# Patient Record
Sex: Male | Born: 1980 | Race: White | Hispanic: No | Smoking: Current every day smoker
Health system: Southern US, Community
[De-identification: ages and names within clinical notes are randomized; demographics above are authoritative.]

## PROBLEM LIST (undated history)

## (undated) DIAGNOSIS — F32A Depression, unspecified: Secondary | ICD-10-CM

## (undated) DIAGNOSIS — F329 Major depressive disorder, single episode, unspecified: Secondary | ICD-10-CM

## (undated) HISTORY — DX: Major depressive disorder, single episode, unspecified: F32.9

## (undated) HISTORY — DX: Depression, unspecified: F32.A

## (undated) HISTORY — PX: CHOLECYSTECTOMY: SHX55

---
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
## 2000-05-13 HISTORY — PX: GALLBLADDER SURGERY: SHX652

## 2001-03-12 ENCOUNTER — Encounter: Admission: RE | Admit: 2001-03-12 | Discharge: 2001-03-12 | Payer: Self-pay | Admitting: *Deleted

## 2001-03-12 ENCOUNTER — Encounter: Payer: Self-pay | Admitting: *Deleted

## 2001-03-13 ENCOUNTER — Encounter: Admission: RE | Admit: 2001-03-13 | Discharge: 2001-03-13 | Payer: Self-pay | Admitting: *Deleted

## 2001-03-13 ENCOUNTER — Encounter: Payer: Self-pay | Admitting: *Deleted

## 2001-03-19 ENCOUNTER — Ambulatory Visit (HOSPITAL_COMMUNITY): Admission: RE | Admit: 2001-03-19 | Discharge: 2001-03-19 | Payer: Self-pay | Admitting: Gastroenterology

## 2001-03-19 ENCOUNTER — Encounter: Payer: Self-pay | Admitting: Gastroenterology

## 2001-03-20 ENCOUNTER — Ambulatory Visit (HOSPITAL_COMMUNITY): Admission: RE | Admit: 2001-03-20 | Discharge: 2001-03-20 | Payer: Self-pay | Admitting: Gastroenterology

## 2001-03-23 ENCOUNTER — Encounter: Payer: Self-pay | Admitting: Gastroenterology

## 2001-03-23 ENCOUNTER — Ambulatory Visit (HOSPITAL_COMMUNITY): Admission: RE | Admit: 2001-03-23 | Discharge: 2001-03-23 | Payer: Self-pay | Admitting: Gastroenterology

## 2001-03-24 ENCOUNTER — Encounter: Payer: Self-pay | Admitting: Gastroenterology

## 2001-03-24 ENCOUNTER — Ambulatory Visit (HOSPITAL_COMMUNITY): Admission: RE | Admit: 2001-03-24 | Discharge: 2001-03-24 | Payer: Self-pay | Admitting: Gastroenterology

## 2001-04-03 ENCOUNTER — Encounter (INDEPENDENT_AMBULATORY_CARE_PROVIDER_SITE_OTHER): Payer: Self-pay | Admitting: Specialist

## 2001-04-03 ENCOUNTER — Observation Stay (HOSPITAL_COMMUNITY): Admission: RE | Admit: 2001-04-03 | Discharge: 2001-04-04 | Payer: Self-pay | Admitting: Surgery

## 2002-02-13 ENCOUNTER — Emergency Department (HOSPITAL_COMMUNITY): Admission: EM | Admit: 2002-02-13 | Discharge: 2002-02-13 | Payer: Self-pay | Admitting: Emergency Medicine

## 2002-02-13 ENCOUNTER — Encounter: Payer: Self-pay | Admitting: Emergency Medicine

## 2003-06-08 ENCOUNTER — Emergency Department (HOSPITAL_COMMUNITY): Admission: EM | Admit: 2003-06-08 | Discharge: 2003-06-08 | Payer: Self-pay | Admitting: Emergency Medicine

## 2004-11-13 ENCOUNTER — Emergency Department (HOSPITAL_COMMUNITY): Admission: EM | Admit: 2004-11-13 | Discharge: 2004-11-13 | Payer: Self-pay | Admitting: Emergency Medicine

## 2007-06-01 ENCOUNTER — Ambulatory Visit: Payer: Self-pay | Admitting: Family Medicine

## 2007-07-02 ENCOUNTER — Ambulatory Visit: Payer: Self-pay | Admitting: Family Medicine

## 2007-07-12 ENCOUNTER — Emergency Department (HOSPITAL_COMMUNITY): Admission: EM | Admit: 2007-07-12 | Discharge: 2007-07-12 | Payer: Self-pay | Admitting: Emergency Medicine

## 2007-08-13 ENCOUNTER — Ambulatory Visit: Payer: Self-pay | Admitting: Family Medicine

## 2007-08-26 ENCOUNTER — Ambulatory Visit: Payer: Self-pay | Admitting: Family Medicine

## 2008-01-07 ENCOUNTER — Ambulatory Visit: Payer: Self-pay | Admitting: Family Medicine

## 2008-02-06 ENCOUNTER — Emergency Department (HOSPITAL_COMMUNITY): Admission: EM | Admit: 2008-02-06 | Discharge: 2008-02-06 | Payer: Self-pay | Admitting: Emergency Medicine

## 2008-03-02 ENCOUNTER — Ambulatory Visit: Payer: Self-pay | Admitting: Family Medicine

## 2008-07-14 ENCOUNTER — Ambulatory Visit: Payer: Self-pay | Admitting: Family Medicine

## 2008-08-28 ENCOUNTER — Emergency Department (HOSPITAL_COMMUNITY): Admission: EM | Admit: 2008-08-28 | Discharge: 2008-08-28 | Payer: Self-pay | Admitting: Emergency Medicine

## 2008-11-18 ENCOUNTER — Emergency Department (HOSPITAL_COMMUNITY): Admission: EM | Admit: 2008-11-18 | Discharge: 2008-11-18 | Payer: Self-pay | Admitting: Emergency Medicine

## 2008-11-25 ENCOUNTER — Emergency Department (HOSPITAL_COMMUNITY): Admission: EM | Admit: 2008-11-25 | Discharge: 2008-11-25 | Payer: Self-pay | Admitting: Emergency Medicine

## 2009-03-02 ENCOUNTER — Ambulatory Visit: Payer: Self-pay | Admitting: Family Medicine

## 2009-09-19 ENCOUNTER — Ambulatory Visit: Payer: Self-pay | Admitting: Physician Assistant

## 2009-10-18 ENCOUNTER — Ambulatory Visit: Payer: Self-pay | Admitting: Family Medicine

## 2010-09-28 NOTE — Procedures (Signed)
[place]  Patient:    Dwayne Drake, Dwayne Drake Visit Number: [id number] MRN: [id number]          Service Type: END Location: ENDO Attending Physician:  Charna Elizabeth Dictated by:   Anselmo Rod, M.D. Proc. Date: 03/20/01 Admit Date:  03/20/2001   CC:         Heather Roberts, M.D.   Procedure Report  DATE OF BIRTH:  Mar 05, 1981  PROCEDURE PERFORMED:  Esophagogastroduodenoscopy.  ENDOSCOPIST:  Anselmo Rod, M.D.  INSTRUMENT USED:  Olympus video panendoscope.  INDICATIONS FOR PROCEDURE:  Severe epigastric pain with nausea and vomiting in a 30 year old white male.  His symptoms started three weeks ago, rule out peptic ulcer disease, gastric ulcers, obstructions.  The patient had an abdominal ultrasound yesterday that was normal.  No gallbladder pathology was noted.  PREPROCEDURE PREPARATION:  Informed consent was obtained from the patient. The patient was fasted for 8 hours prior to the procedure.  PREPROCEDURE PHYSICAL EXAMINATION:  VITAL SIGNS:  Stable.  NECK:  Supple.  CHEST:  Clear to auscultation.  S1 and S2 regular.  ABDOMEN:  Soft, normal bowel sounds.  Epigastric tenderness on palpation with guarding, no rebound or rigidity, no hepatosplenomegaly, no masses palpable.  DESCRIPTION OF PROCEDURE:  The patient was placed in the left lateral decubitus position, and sedated with 75 mg of Demerol and 10 mg of Versed intravenously.  Once the patient was adequately sedated and maintained on low flow oxygen and continuous cardiac monitoring, the Olympus video panendoscope was advanced through the mouth piece, over the tongue, into the esophagus under direct vision.  The entire esophagus appeared normal.  There was no evidence of esophagitis, strictures, masses, or Barretts mucosa.  The scope was then advanced in the stomach.  There was some debris seen in the stomach. The patient seemed to have active vomiting secondary to wretching and  gagging during the procedure, and some of the debris was suctioned out.  Multiple washings were done, no ulcers or erosions were seen.  However, small lesions could have been missed because of the debris in the stomach.  There was a small hiatal hernia seen on retroflexion.  The proximal small bowel appeared normal.  There was no evidence of gastric outlet obstruction.  The patient tolerated the procedure well without complication.  IMPRESSION: 1. Normal appearing esophagus and proximal small bowel. 2. A large amount of debris in the stomach.  No ulcers or gastric outlet    obstruction seen.  RECOMMENDATIONS: 1. Gastric emptying study will be done on the patient ASAP. 2. Basic labs, including a CBC with diff, BMET, hepatic function panel,    amylase and lipase will be checked. 3. Outpatient followup once the above mentioned tests have been done. Dictated by:   Anselmo Rod, M.D. Attending Physician:  Charna Elizabeth DD:  03/20/01 TD:  03/22/01 Job: [id number]

## 2010-09-28 NOTE — Op Note (Signed)
[facility]  Patient:    Dwayne Drake, Dwayne Drake Visit Number: [id number] MRN: [id number]          Service Type: SUR Location: 4W 0470 01 Attending Physician:  Shelly Rubenstein Dictated by:   Abigail Miyamoto, M.D. Proc. Date: 04/03/01 Admit Date:  04/03/2001 Discharge Date: 04/04/2001                             Operative Report  PREOPERATIVE DIAGNOSIS:  Biliary dyskinesia.  POSTOPERATIVE DIAGNOSIS:  Biliary dyskinesia.  PROCEDURE:  Laparoscopic cholecystectomy.  SURGEON:  Abigail Miyamoto, M.D.  ASSISTANT:  Zigmund Daniel, M.D.  ANESTHESIA:  General endotracheal anesthesia.  ESTIMATED BLOOD LOSS:  Minimal.  DESCRIPTION OF PROCEDURE:  The patient was brought to the operating room, identified as Iven Finn. He was placed supine on the operating table and general anesthesia was induced. His abdomen was then prepped and draped in the usual sterile fashion. Using a #15 blade, a small transverse incision was made below the umbilicus. The incision was carried down to the fascia which was then opened with a scalpel. A hemostat was then used to pass through the peritoneal cavity. A #0 Vicryl pursestring suture was placed around the fascial opening. The Hasson port was then placed through the opening and insufflation of the abdomen was begun. An 11 mm port was then placed in the patients epigastrium and two 5 mm ports were placed in the patients right flank all under direct vision. The gallbladder was grasped and retracted above the liver bed. Dissection was carried down to the base of the gallbladder. The cystic duct was dissected out, clipped twice proximally, once distally and transected with the scissors. The cystic artery was then dissected out, clipped once proximally, once distally and transected as well. The gallbladder was then slowly dissected free from the liver bed with the electrocautery. Hemostasis appeared to be achieved in the  liver bed with the cautery. Once the gallbladder was excised from the liver bed it was removed through the port at the umbilicus. The umbilicus was then tied in place closing the fascial defect. A separate #0 Vicryl interrupted suture was placed also at the fascia. The abdomen was irrigated with normal saline. Again hemostasis appeared to be achieved. All ports were then removed under direct vision and the abdomen was deflated. The patient tolerated the procedure well. All sponge, needle and instrument counts were correct at the end of the procedure. The patient was then extubated in the operating room and taken in stable condition to the recovery room. Dictated by:   Abigail Miyamoto, M.D. Attending Physician:  Shelly Rubenstein DD:  04/03/01 TD:  04/05/01 Job: [id number]
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
## 2011-02-11 LAB — URINALYSIS, ROUTINE W REFLEX MICROSCOPIC
Bilirubin Urine: NEGATIVE
Glucose, UA: NEGATIVE
Nitrite: NEGATIVE
Urobilinogen, UA: 0.2

## 2011-05-20 ENCOUNTER — Encounter: Payer: Self-pay | Admitting: Internal Medicine

## 2011-05-20 ENCOUNTER — Ambulatory Visit (INDEPENDENT_AMBULATORY_CARE_PROVIDER_SITE_OTHER): Payer: PRIVATE HEALTH INSURANCE | Admitting: Internal Medicine

## 2011-05-20 VITALS — BP 110/74 | HR 61 | Temp 97.1°F | Resp 16 | Ht 64.0 in | Wt 133.0 lb

## 2011-05-20 DIAGNOSIS — Z720 Tobacco use: Secondary | ICD-10-CM | POA: Insufficient documentation

## 2011-05-20 DIAGNOSIS — F17200 Nicotine dependence, unspecified, uncomplicated: Secondary | ICD-10-CM

## 2011-05-20 DIAGNOSIS — F418 Other specified anxiety disorders: Secondary | ICD-10-CM

## 2011-05-20 DIAGNOSIS — F341 Dysthymic disorder: Secondary | ICD-10-CM

## 2011-05-20 MED ORDER — ALPRAZOLAM 0.5 MG PO TABS: 0.5000 mg | ORAL_TABLET | Freq: Two times a day (BID) | ORAL | Status: DC | PRN

## 2011-05-20 MED ORDER — BUSPIRONE HCL 10 MG PO TABS: 10.0000 mg | ORAL_TABLET | Freq: Three times a day (TID) | ORAL | Status: AC

## 2011-05-20 NOTE — Progress Notes (Signed)
  Subjective:    Patient ID: Dwayne Drake, male    DOB: [birth year], 31 y.o.   MRN: [id number]  HPI  New to me he requests a refill on meds for GAD/panic/depression, he has been on buspar and xanax for about 3 years and he has had a good response to these but his prior PCP is no longer available and he wants to continue on them.  Review of Systems  Constitutional: Negative.   HENT: Negative.   Eyes: Negative.   Respiratory: Negative.   Cardiovascular: Negative for chest pain, palpitations and leg swelling.  Gastrointestinal: Negative.   Genitourinary: Negative.   Musculoskeletal: Negative.   Skin: Negative.   Neurological: Negative.   Hematological: Negative for adenopathy. Does not bruise/bleed easily.  Psychiatric/Behavioral: Positive for sleep disturbance and dysphoric mood. Negative for suicidal ideas, hallucinations, behavioral problems, confusion, self-injury, decreased concentration and agitation. The patient is nervous/anxious. The patient is not hyperactive.        Objective:   Physical Exam  Vitals reviewed. Constitutional: He is oriented to person, place, and time. He appears well-developed and well-nourished. No distress.  HENT:  Head: Normocephalic and atraumatic.  Mouth/Throat: No oropharyngeal exudate.  Eyes: Conjunctivae are normal. Right eye exhibits no discharge. Left eye exhibits no discharge. No scleral icterus.  Neck: Normal range of motion. Neck supple. No JVD present. No tracheal deviation present. No thyromegaly present.  Cardiovascular: Normal rate, regular rhythm, normal heart sounds and intact distal pulses.  Exam reveals no gallop and no friction rub.   No murmur heard. Pulmonary/Chest: Effort normal and breath sounds normal. No stridor. No respiratory distress. He has no wheezes. He has no rales. He exhibits no tenderness.  Abdominal: Soft. Bowel sounds are normal. He exhibits no distension and no mass. There is no tenderness. There is no rebound and  no guarding.  Musculoskeletal: Normal range of motion. He exhibits no edema and no tenderness.  Lymphadenopathy:    He has no cervical adenopathy.  Neurological: He is oriented to person, place, and time.  Skin: Skin is warm and dry. No rash noted. He is not diaphoretic. No erythema. No pallor.  Psychiatric: He has a normal mood and affect. His behavior is normal. Judgment and thought content normal.          Assessment & Plan:

## 2011-05-20 NOTE — Patient Instructions (Signed)

## 2011-05-20 NOTE — Assessment & Plan Note (Signed)
Restart buspar and xanax, he deferred on psychotherapy, I asked him to stop drinking

## 2011-05-20 NOTE — Assessment & Plan Note (Signed)
I asked him to stop smoking and he agrees to try

## 2011-07-19 ENCOUNTER — Telehealth: Payer: Self-pay | Admitting: Internal Medicine

## 2011-07-19 NOTE — Telephone Encounter (Signed)
The pt called and is hoping to get a refill of xanax. Thanks!

## 2011-07-22 NOTE — Telephone Encounter (Signed)
Returned call to patient//LMOVM to call back, need to know if he received printed rx  From 05/20/11 #[id number]

## 2011-07-24 NOTE — Telephone Encounter (Signed)
Rx called to [facility]

## 2011-09-23 ENCOUNTER — Telehealth: Payer: Self-pay

## 2011-09-23 NOTE — Telephone Encounter (Signed)
Error

## 2011-09-24 ENCOUNTER — Telehealth: Payer: Self-pay | Admitting: Internal Medicine

## 2011-09-24 DIAGNOSIS — F418 Other specified anxiety disorders: Secondary | ICD-10-CM

## 2011-09-24 NOTE — Telephone Encounter (Signed)
Pt requesting  xanax reinstated to [facility]--pt ph# [phone]--pt says he travels a lot and prescription was sent to another pharm.

## 2011-09-25 NOTE — Telephone Encounter (Signed)
Pt is requesting refill of Xanax. He states that Rx was filled at a out of town pharmacy and remaining refills cannot be transferred to his local pharmacy. Please advise on refill in Dr Yetta Barre' absence, thanks!

## 2011-09-25 NOTE — Telephone Encounter (Signed)
OK #20 no ref OV w/Dr Yetta Barre next week to discuss Rx Thx

## 2011-09-26 MED ORDER — ALPRAZOLAM 0.5 MG PO TABS: 0.5000 mg | ORAL_TABLET | Freq: Two times a day (BID) | ORAL | Status: DC | PRN

## 2011-09-26 NOTE — Telephone Encounter (Signed)
Pt advised per AVP's recommendation and will call back to schedule OV with [facility].

## 2011-10-10 ENCOUNTER — Ambulatory Visit (INDEPENDENT_AMBULATORY_CARE_PROVIDER_SITE_OTHER): Payer: PRIVATE HEALTH INSURANCE | Admitting: Internal Medicine

## 2011-10-10 ENCOUNTER — Encounter: Payer: Self-pay | Admitting: Internal Medicine

## 2011-10-10 VITALS — BP 118/68 | HR 67 | Temp 97.7°F | Resp 20 | Wt 125.0 lb

## 2011-10-10 DIAGNOSIS — F341 Dysthymic disorder: Secondary | ICD-10-CM

## 2011-10-10 DIAGNOSIS — R079 Chest pain, unspecified: Secondary | ICD-10-CM

## 2011-10-10 DIAGNOSIS — F329 Major depressive disorder, single episode, unspecified: Secondary | ICD-10-CM

## 2011-10-10 DIAGNOSIS — F17200 Nicotine dependence, unspecified, uncomplicated: Secondary | ICD-10-CM

## 2011-10-10 DIAGNOSIS — F418 Other specified anxiety disorders: Secondary | ICD-10-CM

## 2011-10-10 DIAGNOSIS — Z720 Tobacco use: Secondary | ICD-10-CM

## 2011-10-10 MED ORDER — SERTRALINE HCL 50 MG PO TABS: 50.0000 mg | ORAL_TABLET | Freq: Every day | ORAL | Status: DC

## 2011-10-10 MED ORDER — ALPRAZOLAM 0.5 MG PO TABS: 0.5000 mg | ORAL_TABLET | Freq: Three times a day (TID) | ORAL | Status: AC | PRN

## 2011-10-10 NOTE — Assessment & Plan Note (Signed)
His EKG does not look suspicious though he does have some benign variation c/w his body shape, his pain sounds emotional and MS so I did not pursue any urgent interventions, today I have asked him to get a CXR done to look for structural lesions in his lungs and chest wall, I will check some cardiac enzymes to be certain that he does not have any ischemia and a d-dimer to look for PE, I will also look at other labs to look for organ pathology

## 2011-10-10 NOTE — Patient Instructions (Signed)
Anxiety and Panic Attacks  Your caregiver has informed you that you are having an anxiety or panic attack. There may be many forms of this. Most of the time these attacks come suddenly and without warning. They come at any time of day, including periods of sleep, and at any time of life. They may be strong and unexplained. Although panic attacks are very scary, they are physically harmless. Sometimes the cause of your anxiety is not known. Anxiety is a protective mechanism of the body in its fight or flight mechanism. Most of these perceived danger situations are actually nonphysical situations (such as anxiety over losing a job).  CAUSES    The causes of an anxiety or panic attack are many. Panic attacks may occur in otherwise healthy people given a certain set of circumstances. There may be a genetic cause for panic attacks. Some medications may also have anxiety as a side effect.  SYMPTOMS    Some of the most common feelings are:   Intense terror.   Dizziness, feeling faint.   Hot and cold flashes.   Fear of going crazy.   Feelings that nothing is real.   Sweating.   Shaking.   Chest pain or a fast heartbeat (palpitations).   Smothering, choking sensations.   Feelings of impending doom and that death is near.   Tingling of extremities, this may be from over-breathing.   Altered reality (derealization).   Being detached from yourself (depersonalization).  Several symptoms can be present to make up anxiety or panic attacks.  DIAGNOSIS    The evaluation by your caregiver will depend on the type of symptoms you are experiencing. The diagnosis of anxiety or panic attack is made when no physical illness can be determined to be a cause of the symptoms.  TREATMENT    Treatment to prevent anxiety and panic attacks may include:   Avoidance of circumstances that cause anxiety.   Reassurance and relaxation.   Regular exercise.   Relaxation therapies, such as yoga.    Psychotherapy with a psychiatrist or therapist.   Avoidance of caffeine, alcohol and illegal drugs.   Prescribed medication.  SEEK IMMEDIATE MEDICAL CARE IF:     You experience panic attack symptoms that are different than your usual symptoms.   You have any worsening or concerning symptoms.  Document Released: 04/29/2005 Document Revised: 04/18/2011 Document Reviewed: 08/31/2009  Exit[employer] Patient Information 2012 [employer]    Chest Pain (Nonspecific)  It is often hard to give a specific diagnosis for the cause of chest pain. There is always a chance that your pain could be related to something serious, such as a heart attack or a blood clot in the lungs. You need to follow up with your caregiver for further evaluation.  CAUSES     Heartburn.   Pneumonia or bronchitis.   Anxiety or stress.   Inflammation around your heart (pericarditis) or lung (pleuritis or pleurisy).   A blood clot in the lung.   A collapsed lung (pneumothorax). It can develop suddenly on its own (spontaneous pneumothorax) or from injury (trauma) to the chest.   Shingles infection (herpes zoster virus).  The chest wall is composed of bones, muscles, and cartilage. Any of these can be the source of the pain.   The bones can be bruised by injury.   The muscles or cartilage can be strained by coughing or overwork.   The cartilage can be affected by inflammation and become sore (costochondritis).  DIAGNOSIS      Lab tests or other studies, such as X-rays, electrocardiography, stress testing, or cardiac imaging, may be needed to find the cause of your pain.    TREATMENT     Treatment depends on what may be causing your chest pain. Treatment may include:   Acid blockers for heartburn.   Anti-inflammatory medicine.   Pain medicine for inflammatory conditions.   Antibiotics if an infection is present.   You may be advised to change lifestyle habits. This includes stopping smoking and avoiding alcohol, caffeine, and chocolate.    You may be advised to keep your head raised (elevated) when sleeping. This reduces the chance of acid going backward from your stomach into your esophagus.   Most of the time, nonspecific chest pain will improve within 2 to 3 days with rest and mild pain medicine.  HOME CARE INSTRUCTIONS     If antibiotics were prescribed, take your antibiotics as directed. Finish them even if you start to feel better.   For the next few days, avoid physical activities that bring on chest pain. Continue physical activities as directed.   Do not smoke.   Avoid drinking alcohol.   Only take over-the-counter or prescription medicine for pain, discomfort, or fever as directed by your caregiver.   Follow your caregiver's suggestions for further testing if your chest pain does not go away.   Keep any follow-up appointments you made. If you do not go to an appointment, you could develop lasting (chronic) problems with pain. If there is any problem keeping an appointment, you must call to reschedule.  SEEK MEDICAL CARE IF:     You think you are having problems from the medicine you are taking. Read your medicine instructions carefully.   Your chest pain does not go away, even after treatment.   You develop a rash with blisters on your chest.  SEEK IMMEDIATE MEDICAL CARE IF:     You have increased chest pain or pain that spreads to your arm, neck, jaw, back, or abdomen.   You develop shortness of breath, an increasing cough, or you are coughing up blood.   You have severe back or abdominal pain, feel nauseous, or vomit.   You develop severe weakness, fainting, or chills.   You have a fever.  THIS IS AN EMERGENCY. Do not wait to see if the pain will go away. Get medical help at once. Call your local emergency services (911 in [place] Do not drive yourself to the hospital.  MAKE SURE YOU:     Understand these instructions.   Will watch your condition.   Will get help right away if you are not doing well or get worse.   Document Released: 02/06/2005 Document Revised: 04/18/2011 Document Reviewed: 12/03/2007  [employer] Patient Information 2012 [employer]

## 2011-10-10 NOTE — Progress Notes (Signed)
Subjective:    Patient ID: Dwayne Drake, male    DOB: 02/05/1981, 31 y.o.   MRN: [id number]  Chest Pain  This is a new problem. The current episode started more than 1 month ago. The onset quality is gradual. The problem occurs intermittently. The problem has been unchanged. The pain is present in the lateral region (left and right). The pain is at a severity of 2/10. The pain is mild. The quality of the pain is described as sharp. The pain does not radiate. Pertinent negatives include no abdominal pain, back pain, claudication, cough, diaphoresis, dizziness, exertional chest pressure, fever, headaches, hemoptysis, irregular heartbeat, leg pain, lower extremity edema, malaise/fatigue, nausea, near-syncope, numbness, orthopnea, palpitations, PND, shortness of breath, sputum production, syncope, vomiting or weakness. The pain is aggravated by emotional upset. He has tried nothing for the symptoms. Risk factors include smoking/tobacco exposure.  Pertinent negatives for past medical history include no seizures.      Review of Systems  Constitutional: Negative for fever, chills, malaise/fatigue, diaphoresis, activity change, appetite change, fatigue and unexpected weight change.  HENT: Negative.   Eyes: Negative.   Respiratory: Negative for apnea, cough, hemoptysis, sputum production, choking, chest tightness, shortness of breath, wheezing and stridor.   Cardiovascular: Positive for chest pain. Negative for palpitations, orthopnea, claudication, leg swelling, syncope, PND and near-syncope.  Gastrointestinal: Negative for nausea, vomiting, abdominal pain, diarrhea, constipation, blood in stool, abdominal distention and anal bleeding.  Genitourinary: Negative.   Musculoskeletal: Negative for myalgias, back pain, arthralgias and gait problem.  Skin: Negative for color change, pallor, rash and wound.  Neurological: Negative for dizziness, tremors, seizures, syncope, facial asymmetry, speech  difficulty, weakness, light-headedness, numbness and headaches.  Hematological: Negative for adenopathy. Does not bruise/bleed easily.  Psychiatric/Behavioral: Positive for sleep disturbance and dysphoric mood. Negative for suicidal ideas, hallucinations, behavioral problems, confusion, self-injury, decreased concentration and agitation. The patient is nervous/anxious (and panic attacks). The patient is not hyperactive.        Objective:   Physical Exam  Vitals reviewed. Constitutional: He is oriented to person, place, and time. He appears well-developed and well-nourished. No distress.  HENT:  Head: Normocephalic and atraumatic.  Mouth/Throat: Oropharynx is clear and moist. No oropharyngeal exudate.  Eyes: Conjunctivae are normal. Right eye exhibits no discharge. Left eye exhibits no discharge. No scleral icterus.  Neck: Normal range of motion. Neck supple. No JVD present. No tracheal deviation present. No thyromegaly present.  Cardiovascular: Normal rate, normal heart sounds and intact distal pulses.  Exam reveals no gallop and no friction rub.   No murmur heard. Pulmonary/Chest: Effort normal and breath sounds normal. No accessory muscle usage or stridor. Not tachypneic. No respiratory distress. He has no decreased breath sounds. He has no wheezes. He has no rhonchi. He has no rales. Chest wall is not dull to percussion. He exhibits no mass, no tenderness, no bony tenderness, no laceration, no crepitus, no edema, no deformity, no swelling and no retraction.  Abdominal: Soft. Bowel sounds are normal. He exhibits no distension and no mass. There is no tenderness. There is no rebound and no guarding.  Musculoskeletal: Normal range of motion. He exhibits no edema and no tenderness.  Lymphadenopathy:    He has no cervical adenopathy.  Neurological: He is oriented to person, place, and time.  Skin: Skin is warm and dry. No rash noted. He is not diaphoretic. No erythema. No pallor.  Psychiatric:  His behavior is normal. Judgment and thought content normal. His mood appears anxious. His  affect is not angry, not blunt, not labile and not inappropriate. His speech is not rapid and/or pressured, not delayed, not tangential and not slurred. He is not agitated, not aggressive, is not hyperactive, not slowed, not withdrawn, not actively hallucinating and not combative. Thought content is not paranoid and not delusional. Cognition and memory are normal. Cognition and memory are not impaired. He does not express impulsivity or inappropriate judgment. He does not exhibit a depressed mood. He expresses no homicidal and no suicidal ideation. He expresses no suicidal plans and no homicidal plans. He is communicative. He exhibits normal recent memory and normal remote memory. He is attentive.          Assessment & Plan:

## 2011-10-10 NOTE — Assessment & Plan Note (Signed)
He has increased his dose of xanax and therefore ran out early so I gave him an early refill today, I want to check his labs to look for organic illness like thyroid disease that can cause anxiety and will check his UDS to screen for substance abuse, he was asked to start sertraline to help with the anxiety and depression.

## 2011-10-10 NOTE — Assessment & Plan Note (Signed)
He is not ready to quit smoking 

## 2011-10-16 ENCOUNTER — Encounter: Payer: Self-pay | Admitting: Internal Medicine

## 2011-10-18 ENCOUNTER — Telehealth: Payer: Self-pay | Admitting: Internal Medicine

## 2011-10-18 NOTE — Telephone Encounter (Signed)
Dismissal Letter sent by Certified Mail 10/18/2011  Received Return Receipt showing that the Dismissal Letter has been picked up 10/21/2011

## 2013-08-27 ENCOUNTER — Emergency Department (HOSPITAL_COMMUNITY)
Admission: EM | Admit: 2013-08-27 | Discharge: 2013-08-27 | Disposition: A | Discharge status: Home / Self Care | Payer: PRIVATE HEALTH INSURANCE | Attending: Emergency Medicine | Admitting: Emergency Medicine

## 2013-08-27 ENCOUNTER — Encounter (HOSPITAL_COMMUNITY): Payer: Self-pay | Admitting: Emergency Medicine

## 2013-08-27 DIAGNOSIS — Z79899 Other long term (current) drug therapy: Secondary | ICD-10-CM | POA: Insufficient documentation

## 2013-08-27 DIAGNOSIS — L539 Erythematous condition, unspecified: Secondary | ICD-10-CM | POA: Insufficient documentation

## 2013-08-27 DIAGNOSIS — F17200 Nicotine dependence, unspecified, uncomplicated: Secondary | ICD-10-CM | POA: Insufficient documentation

## 2013-08-27 DIAGNOSIS — R609 Edema, unspecified: Secondary | ICD-10-CM | POA: Insufficient documentation

## 2013-08-27 DIAGNOSIS — F329 Major depressive disorder, single episode, unspecified: Secondary | ICD-10-CM | POA: Insufficient documentation

## 2013-08-27 DIAGNOSIS — F3289 Other specified depressive episodes: Secondary | ICD-10-CM | POA: Insufficient documentation

## 2013-08-27 DIAGNOSIS — L0390 Cellulitis, unspecified: Secondary | ICD-10-CM

## 2013-08-27 DIAGNOSIS — H60399 Other infective otitis externa, unspecified ear: Secondary | ICD-10-CM | POA: Insufficient documentation

## 2013-08-27 DIAGNOSIS — L0291 Cutaneous abscess, unspecified: Secondary | ICD-10-CM

## 2013-08-27 MED ORDER — HYDROCODONE-ACETAMINOPHEN 5-325 MG PO TABS
2.0000 | ORAL_TABLET | Freq: Once | ORAL | Status: AC
  Administered 2013-08-27: 2 via ORAL
  Filled 2013-08-27: qty 2

## 2013-08-27 MED ORDER — ONDANSETRON 4 MG PO TBDP
4.0000 mg | ORAL_TABLET | Freq: Once | ORAL | Status: AC
  Administered 2013-08-27: 4 mg via ORAL
  Filled 2013-08-27: qty 1

## 2013-08-27 MED ORDER — CLINDAMYCIN HCL 150 MG PO CAPS: 300.0000 mg | ORAL_CAPSULE | Freq: Three times a day (TID) | ORAL | Status: DC

## 2013-08-27 MED ORDER — OXYCODONE-ACETAMINOPHEN 5-325 MG PO TABS: 2.0000 | ORAL_TABLET | ORAL | Status: DC | PRN

## 2013-08-27 NOTE — Discharge Instructions (Signed)
Take Clindamycin as directed until gone. Take Percocet as needed for pain. Refer to attached documents for more information. Return to the ED with worsening or concerning symptoms.  °

## 2013-08-27 NOTE — ED Notes (Signed)
Pt states that right earlobe began swelling 2 days ago with sharp pain shooting to teeth and head, small laceration present to ear lobe, pt states this is from where he "popped" the swelling. Denies recent piercing or gauging to ear.

## 2013-08-27 NOTE — Progress Notes (Signed)
[facility] provided pt with a list of primary care resources to help patient establish primary care.  °

## 2013-08-27 NOTE — ED Provider Notes (Signed)
CSN: [id number]     Arrival date & time 08/27/13  1222 History  This chart was scribed for non-physician practitioner, Emilia BeckKaitlyn Lakeshia Dohner, PA-C, working with Doug SouSam Jacubowitz, MD by Charline BillsEssence Howell, ED Scribe. This patient was seen in room WTR5/WTR5 and the patient's care was started at 1:04 PM.   Chief Complaint  Patient presents with  . Abscess    ear    Patient is a 33 y.o. male presenting with abscess. The history is provided by the patient. No language interpreter was used.  Abscess Location:  Head/neck Head/neck abscess location:  R ear Abscess quality: fluctuance and painful   Duration:  2 days Pain details:    Quality:  Sharp and shooting   Severity:  Severe   Duration:  2 days   Timing:  Constant   Progression:  Worsening Chronicity:  New Relieved by:  Nothing Worsened by:  Nothing tried Ineffective treatments:  None tried Associated symptoms: fever (subjective)    HPI Comments: Dwayne Drake is a 33 y.o. male who presents to the Emergency Department complaining of R earlobe abscess onset 2 days ago. He reports associated swelling and a sharp, shooting pain that radiates to his jaw. Pain is worsened with tilting his head. Pt also reports a subjective fever, ED temperature 98.2 F. Pt tried to "pop" the abscess yesterday with no relief.  Past Medical History  Diagnosis Date  . Depression    Past Surgical History  Procedure Laterality Date  . Gallbladder surgery  2002  . Cholecystectomy     Family History  Problem Relation Age of Onset  . Cancer Neg Hx   . Alcohol abuse Neg Hx   . Diabetes Neg Hx   . Drug abuse Neg Hx   . Depression Neg Hx   . Heart disease Neg Hx    History  Substance Use Topics  . Smoking status: Current Every Day Smoker -- 2.00 packs/day for 15 years    Types: Cigarettes  . Smokeless tobacco: Never Used  . Alcohol Use: 9.0 oz/week    15 Cans of beer per week    Review of Systems  Constitutional: Positive for fever (subjective).   HENT: Positive for ear pain.   All other systems reviewed and are negative.  Allergies  Sulfa drugs cross reactors  Home Medications   Prior to Admission medications   Medication Sig Start Date End Date Taking? Authorizing Provider  sertraline (ZOLOFT) 50 MG tablet Take 1 tablet (50 mg total) by mouth daily. 10/10/11 10/09/12  Etta Grandchildhomas L Jones, MD   Triage Vitals: BP 122/76  Pulse 111  Temp(Src) 98.2 F (36.8 C) (Oral)  Resp 16  SpO2 100% Physical Exam  Nursing note and vitals reviewed. Constitutional: He is oriented to person, place, and time. He appears well-developed and well-nourished. No distress.  HENT:  Head: Normocephalic and atraumatic.  Right Ear: There is swelling and tenderness.  Fluctuant mass noted at R ear  Eyes: EOM are normal.  Neck: Neck supple. Muscular tenderness (R neck) present. Edema (R neck) and erythema (R neck) present.  Cardiovascular: Normal rate.   Pulmonary/Chest: Effort normal. No respiratory distress.  Musculoskeletal: Normal range of motion.  Lymphadenopathy:    He has no cervical adenopathy.  Neurological: He is alert and oriented to person, place, and time.  Skin: Skin is warm and dry.      Psychiatric: He has a normal mood and affect. His behavior is normal.    ED Course  Procedures (including critical  care time) DIAGNOSTIC STUDIES: Oxygen Saturation is 100% on RA, normal by my interpretation.    INCISION AND DRAINAGE PROCEDURE NOTE: Patient identification was confirmed and verbal consent was obtained. This procedure was performed by Emilia BeckKaitlyn Oona Trammel, PA-C at 1:07 PM. Site: R earlobe Anesthetic used (type and amt): none Blade size: 10 blade Drainage: 3 mL Complexity: Complex Packing used: none Site anesthetized, incision made over site, wound drained and explored loculations, rinsed with copious amounts of normal saline, wound packed with sterile gauze, covered with dry, sterile dressing.  Pt tolerated procedure well without  complications.  Instructions for care discussed verbally and pt provided with additional written instructions for homecare and f/u.  COORDINATION OF CARE: 1:16 PM-Discussed treatment plan which includes I&D, antibiotics and medication for pain with pt at bedside and pt agreed to plan.   Labs Review Labs Reviewed - No data to display  Imaging Review No results found.   EKG Interpretation None      MDM   Final diagnoses:  Abscess and cellulitis    Abscess drained without complication. Patient will be discharged with Clindamycin and Percocet. Patient is tachycardic likely due to pain. Other vitals stable and patient afebrile.   I personally performed the services described in this documentation, which was scribed in my presence. The recorded information has been reviewed and is accurate.    Emilia BeckKaitlyn Madaleine Simmon, PA-C 08/27/13 1820

## 2013-08-27 NOTE — ED Provider Notes (Signed)
Medical screening examination/treatment/procedure(s) were performed by non-physician practitioner and as supervising physician I was immediately available for consultation/collaboration.   EKG Interpretation None        Gwyneth SproutWhitney Marrietta Thunder, MD 08/27/13 2144

## 2013-08-31 ENCOUNTER — Emergency Department (HOSPITAL_COMMUNITY)
Admission: EM | Admit: 2013-08-31 | Discharge: 2013-08-31 | Disposition: A | Discharge status: Home / Self Care | Payer: PRIVATE HEALTH INSURANCE | Attending: Emergency Medicine | Admitting: Emergency Medicine

## 2013-08-31 ENCOUNTER — Encounter (HOSPITAL_COMMUNITY): Payer: Self-pay | Admitting: Emergency Medicine

## 2013-08-31 DIAGNOSIS — L0390 Cellulitis, unspecified: Secondary | ICD-10-CM

## 2013-08-31 DIAGNOSIS — Z791 Long term (current) use of non-steroidal anti-inflammatories (NSAID): Secondary | ICD-10-CM | POA: Insufficient documentation

## 2013-08-31 DIAGNOSIS — L0291 Cutaneous abscess, unspecified: Secondary | ICD-10-CM

## 2013-08-31 DIAGNOSIS — H60399 Other infective otitis externa, unspecified ear: Secondary | ICD-10-CM | POA: Insufficient documentation

## 2013-08-31 DIAGNOSIS — Z792 Long term (current) use of antibiotics: Secondary | ICD-10-CM | POA: Insufficient documentation

## 2013-08-31 DIAGNOSIS — Z8659 Personal history of other mental and behavioral disorders: Secondary | ICD-10-CM | POA: Insufficient documentation

## 2013-08-31 DIAGNOSIS — F17200 Nicotine dependence, unspecified, uncomplicated: Secondary | ICD-10-CM | POA: Insufficient documentation

## 2013-08-31 MED ORDER — CLINDAMYCIN HCL 150 MG PO CAPS: 450.0000 mg | ORAL_CAPSULE | Freq: Four times a day (QID) | ORAL | Status: DC

## 2013-08-31 MED ORDER — OXYCODONE-ACETAMINOPHEN 5-325 MG PO TABS: 1.0000 | ORAL_TABLET | Freq: Three times a day (TID) | ORAL | Status: DC | PRN

## 2013-08-31 NOTE — Discharge Instructions (Signed)
Please call your doctor for a followup appointment within 24-48 hours. When you talk to your doctor please let them know that you were seen in the emergency department and have them acquire all of your records so that they can discuss the findings with you and formulate a treatment plan to fully care for your new and ongoing problems. Please call and set-up an appointment with Ear, Nose, and Throat physician for re-assessment - need to be re-assessed within 48 hours Please remove packing within 2 days Please take antibiotics as prescribed and on full stomac Please take medications, pain medications as prescribed rash on pain medications there is to be no drinking alcohol, driving, operating any heavy machinery. If there is extra please disposer proper manner. Please do not take any extra Tylenol for this can lead to Tylenol overdose and liver failure. Please apply warm compressions Please rest and stay hydrated Please keep site covered with gauze - please keep dry, if the gauze become wet, please change Please continue to monitor symptoms closely and if symptoms are to worsen or change (fever greater than 101, chills, chest pain, shortness of breath, difficulty breathing, numbness, tingling, swelling to the face, numbness to the face, inability to swallow, neck pain, active drainage, active bleeding, blurred vision) please report back to the ED immediately   Abscess An abscess (boil or furuncle) is an infected area on or under the skin. This area is filled with yellowish-white fluid (pus) and other material (debris). HOME CARE   Only take medicines as told by your doctor.  If you were given antibiotic medicine, take it as directed. Finish the medicine even if you start to feel better.  If gauze is used, follow your doctor's directions for changing the gauze.  To avoid spreading the infection:  Keep your abscess covered with a bandage.  Wash your hands well.  Do not share personal care  items, towels, or whirlpools with others.  Avoid skin contact with others.  Keep your skin and clothes clean around the abscess.  Keep all doctor visits as told. GET HELP RIGHT AWAY IF:   You have more pain, puffiness (swelling), or redness in the wound site.  You have more fluid or blood coming from the wound site.  You have muscle aches, chills, or you feel sick.  You have a fever. MAKE SURE YOU:   Understand these instructions.  Will watch your condition.  Will get help right away if you are not doing well or get worse. Document Released: 10/16/2007 Document Revised: 10/29/2011 Document Reviewed: 07/12/2011 [facility] Patient Information 2014 [place]Care, [place].  Cellulitis Cellulitis is an infection of the skin and the tissue under the skin. The infected area is usually red and tender. This happens most often in the arms and lower legs. HOME CARE   Take your antibiotic medicine as told. Finish the medicine even if you start to feel better.  Keep the infected arm or leg raised (elevated).  Put a warm cloth on the area up to 4 times per day.  Only take medicines as told by your doctor.  Keep all doctor visits as told. GET HELP RIGHT AWAY IF:   You have a fever.  You feel very sleepy.  You throw up (vomit) or have watery poop (diarrhea).  You feel sick and have muscle aches and pains.  You see red streaks on the skin coming from the infected area.  Your red area gets bigger or turns a dark color.  Your bone or  joint under the infected area is painful after the skin heals.  Your infection comes back in the same area or different area.  You have a puffy (swollen) bump in the infected area.  You have new symptoms. MAKE SURE YOU:   Understand these instructions.  Will watch your condition.  Will get help right away if you are not doing well or get worse. Document Released: 10/16/2007 Document Revised: 10/29/2011 Document Reviewed: 07/15/2011 [facility]  Patient Information 2014 [place]ExitCare, [place]LLC.   Emergency Department Resource Guide 1) Find a Doctor and Pay Out of Pocket Although you won't have to find out who is covered by your insurance plan, it is a good idea to ask around and get recommendations. You will then need to call the office and see if the doctor you have chosen will accept you as a new patient and what types of options they offer for patients who are self-pay. Some doctors offer discounts or will set up payment plans for their patients who do not have insurance, but you will need to ask so you aren't surprised when you get to your appointment.  2) Contact Your Local Health Department Not all health departments have doctors that can see patients for sick visits, but many do, so it is worth a call to see if yours does. If you don't know where your local health department is, you can check in your phone book. The CDC also has a tool to help you locate your state's health department, and many state websites also have listings of all of their local health departments.  3) Find a Walk-in Clinic If your illness is not likely to be very severe or complicated, you may want to try a walk in clinic. These are popping up all over the country in pharmacies, drugstores, and shopping centers. They're usually staffed by nurse practitioners or physician assistants that have been trained to treat common illnesses and complaints. They're usually fairly quick and inexpensive. However, if you have serious medical issues or chronic medical problems, these are probably not your best option.  No Primary Care Doctor: - Call Health Connect at  [phone] - they can help you locate a primary care doctor that  accepts your insurance, provides certain services, etc. - Physician Referral Service- [phone]  Chronic Pain Problems: Organization         Address  Phone   Notes  [facility]  [phone] Patients need to be referred by their  primary care doctor.   Medication Assistance: Organization         Address  Phone   Notes  [facility] Medication [facility] [address] [place], [place] [address] [phone]) [phone] --Must be a resident of [place] [facility] [place] -- Must have NO insurance coverage whatsoever (no Medicaid/ Medicare, etc.) -- The pt. MUST have a primary care doctor that directs their care regularly and follows them in the community   MedAssist  [phone]) [phone]   [employer]  [phone]    Agencies that provide inexpensive medical care: Organization         Address  Phone   Notes  [facility]  [phone]   [facility]    [phone]) [phone]   [facility] [address] [place], [place] [address] [phone]) [phone]   [facility] [address], [place] [phone]) [phone]   Planned Parenthood    [phone]) [phone]   [facility]    [phone]   [facility]  [address], [place] Phone:  [phone]) [phone], Fax:  [phone] Hours of Operation:  9 am - 6 pm, M-F.  Also accepts Medicaid/Medicare and self-pay.  [facility]  [address], [place] Phone: [phone]) [phone], Fax: [phone]. Hours of Operation:  8:30 am - 5:30 pm, M-F.  Also accepts Medicaid and self-pay.  [facility] [place] [address], [place] Phone: [phone](336) [phone]   [facility] [address], [place], [place] [phone], Ext. [phone] Mondays & Thursdays: 7-9 AM.  First 15 patients are seen on a first come, first serve basis.    Medicaid-accepting [facility] [place] Providers:  Organization         Address  Phone   Notes  [facility] [address], [place] [phone] Also accepts self-pay patients.  [facility] [address], [place]  [phone]) [phone]   [facility] [address], [place] [phone]   [facility] [address] [place] [address], [place] [phone]) [phone]   Renaye RakersVeita Bland [address], [place]   [phone] Only accepts [place] Access [place] patients after they have their name applied to their card.   Self-Pay (no insurance) in [facility]:  Organization         Address  Phone   Notes  Sickle Cell Patients, [facility] [address], [place] [phone]) [phone]   [facility] [address], [place] [phone]   [facility] [place]  [address], [place] [phone]   [facility]/Dr. Osei-Bonsu  [address] [place] [address], [place] or [phone], [place] [phone] Phone number for both [place] and [place] locations is the same.  [facility] [address], [place] [phone]   [facility] [address] [place] [address], [place] or [address] [phone]) [phone]   [facility] [address], [place] [phone], phone; [phone]) [phone], fax Sees patients 1st and 3rd Saturday of every month.  Must not qualify for public or private insurance (i.e. Medicaid, Medicare, [place] Health Choice, Veterans' Benefits)  Household income should be no more than 200% of the poverty level The clinic cannot treat you if you are pregnant or think you are pregnant  Sexually transmitted diseases are not treated at the clinic.    Dental Care: Organization         Address  Phone  Notes  [facility] Department of [facility] [address], [place] [phone] Accepts children up to age 33 who are enrolled in [place]icaid or [place] Health Choice; pregnant women with a Medicaid card; and children who have applied for Medicaid or [place] Health Choice, but were declined, whose parents can pay a reduced fee  at time of service.  [facility] of [facility] [place]  [address], [place] [phone] Accepts children up to age 33 who are enrolled in [place]icaid or [place] Health Choice; pregnant women with a Medicaid card; and children who have applied for Medicaid or [place] Health Choice, but were declined, whose parents can pay a reduced fee at time of  service.  [facility]  [address] [place], [place] [phone] Patients are seen by appointment only. Walk-ins are not accepted. [facility] will see patients 36 years of age and older. Monday - Tuesday (8am-5pm) Most Wednesdays (8:30-5pm) $30 per visit, cash only  [facility] Access PRO[facility]  [address], [facility] [phone] Patients are seen by appointment only. Walk-ins are not accepted. [facility] will see patients 58 years of age and older. One Wednesday Evening (Monthly: Volunteer Based).  $30 per visit, cash only  [employer]  [phone] for adults; Children under age 40, call Graduate Pediatric Dentistry at [phone]. Children aged 69-14, please call [phone] to request a pediatric application.  Dental services are provided in all areas of dental care including fillings, crowns and bridges, complete and partial dentures, implants, gum treatment, root canals, and extractions. Preventive care is also provided. Treatment is provided to both adults and children. Patients are selected via a lottery and there is often a waiting list.   [facility] [address], [place]  [phone] [email or web address]   [facility] [address] [place], [place] [phone], Ext. [phone] Second and Fourth Thursday of each month, opens at 6:30 AM; Clinic ends at 9 AM.  Patients are seen on a first-come first-served basis, and a limited number are seen during each clinic.   [facility]  [address] [place], [place] [phone]   Eligibility Requirements You must have lived in [place], [place], or [place] counties for at least the last three months.   You cannot be eligible for state or federal sponsored [place], including [employer], [place], or [employer].   You generally cannot be eligible for healthcare insurance through your employer.    How to apply: Eligibility screenings are held every Tuesday and Wednesday afternoon from 1:00 pm until 4:00 pm. You do not need an appointment for the interview!  [facility] [address] [place], [place] [phone]   [facility] Health Department  [phone]   [facility] Health Department  [phone]   [facility] Health Department  [phone]    Behavioral Health Resources in the Community: Intensive Outpatient Programs Organization         Address  Phone  Notes  [facility] [address], [place], [place] [phone]   [facility]patient [address], [place], [place] [phone]   [facility]: Alcohol & Drug Svcs [address], [place], [place]  [phone]   [facility] Mental Health [address]  [place], [place] [phone] or [phone]   Substance Abuse Resources Organization         Address  Phone  Notes  Alcohol [facility] Drug Services  [phone]   [facility]  [phone]   [facility] [place]  [phone]   Floydene Flock  [phone]   Residential & Outpatient Substance Abuse Program  [phone]   Psychological Services Organization         Address  Phone  Notes  [facility]  [phone]   [facility] Services  [phone]   [facility] Mental Health [address], [place] [phone] or [phone]    Mobile Crisis Teams Organization         Address  Phone  Notes  [facility], Mobile Crisis Care Unit  [phone]   Assertive Psychotherapeutic  Services  [address] [place], [place] [phone]   [facility] [address] [place] [phone]    Self-Help/Support Groups Organization         Address  Phone             Notes  [facility]oc[facility] of [place] - variety of support groups  [phone]- [phone] Call for more information  Narcotics Anonymous (NA), [facility] [address], [employer] [place]  2 meetings at this location   Statistician         Address  Phone  Notes  [facility] [address],    [place]  [phone]   [facility]  [address] [place] 7[address]696, [place], [place] [phone]   [facility] [address] [place], [place] [phone] Admissions: 8am-3pm M-F  [facility] [address],    [place], [place] [phone]   [facility] [address] [place], [place], [place] [phone]   [facility] [address]  [place], [place] [phone]   [facility] - Intensive Outpatient [address], [place], [place] [phone]   [facility] ([facility]iction [facility] [address]  [place], [place] [phone] or [phone]   [facility] (RTS) [address] [place], [place] [phone] Accepts Medicaid  [facility] [address],  [place] [phone] Substance Abuse/Addiction Treatment   [facility] Organization         Address  Phone  Notes  [facility]  [phone]   Angie Fava, PhD [address] Ervin Knack [place], [place]   [phone] or [phone]   [facility]   [address] [place], [place] [phone]   [facility] [address], [place], [place] [phone] Insurance/Medicaid/sponsorship through [facility] [address]                                    [place], [place] [phone] Therapy/tele-psych/case  [facility] [address], [place] [phone]    Dr. Lolly Mustache  [phone]   [facility] [place]  [employer] [facility] Dept. 1) [address] [place] 2) [address], [place] 3)  [address], [place] [phone]  [phone]   [facility] Child Abuse Hotline (551) 620-2872 or ([phone] 789-0023 (After Hours)

## 2013-08-31 NOTE — ED Notes (Signed)
Pt presents w/ abscess to right ear/cheek area. Was seen on the 17th for same, was lanced and sent home. States it is beginning to get bigger again. No drainage at the moment.

## 2013-08-31 NOTE — ED Provider Notes (Signed)
CSN: [id number]     Arrival date & time 08/31/13  1337 History  This chart was scribed for non-physician practitioner, Raymon MuttonMarissa Finnley Lewis, PA-C working with Suzi RootsKevin E Steinl, MD by Greggory StallionKayla Andersen, ED scribe. This patient was seen in room WTR8/WTR8 and the patient's care was started at 3:18 PM.   Chief Complaint  Patient presents with  . Abscess   The history is provided by the patient. No language interpreter was used.   HPI Comments: Dwayne Drake is a 33 y.o. male who presents to the Emergency Department complaining of an abscess to his right ear that started on 08/25/13. Pt was evaluated for the same on 08/27/13, had it I&D'd and was discharged home with clindamycin. He states it has gotten more swollen and painful since he was last seen. Pain radiates into his ear. There has not been any active drainage for 3 days. Pt has done warm compresses and used tea tree oil with little relief. Turning his head to the left worsens the pain and causes it to radiate into his neck and teeth. He has not followed up with ENT. Denies fever, chills, trouble swallowing, chest pain, SOB, difficulty breathing, blurred vision, sudden loss of vision.   No PCP - per patient  Past Medical History  Diagnosis Date  . Depression    Past Surgical History  Procedure Laterality Date  . Gallbladder surgery  2002  . Cholecystectomy     Family History  Problem Relation Age of Onset  . Cancer Neg Hx   . Alcohol abuse Neg Hx   . Diabetes Neg Hx   . Drug abuse Neg Hx   . Depression Neg Hx   . Heart disease Neg Hx    History  Substance Use Topics  . Smoking status: Current Every Day Smoker -- 2.00 packs/day for 15 years    Types: Cigarettes  . Smokeless tobacco: Never Used  . Alcohol Use: 9.0 oz/week    15 Cans of beer per week    Review of Systems  Constitutional: Negative for fever and chills.  HENT: Positive for ear pain. Negative for trouble swallowing.   Eyes: Negative for visual disturbance.   Respiratory: Negative for shortness of breath.   Cardiovascular: Negative for chest pain.  Skin:       Abscess.  All other systems reviewed and are negative.  Allergies  Sulfa drugs cross reactors  Home Medications   Prior to Admission medications   Medication Sig Start Date End Date Taking? Authorizing Provider  clindamycin (CLEOCIN) 150 MG capsule Take 2 capsules (300 mg total) by mouth 3 (three) times daily. May dispense as 150mg  capsules 08/27/13  Yes Kaitlyn Szekalski, PA-C  ibuprofen (ADVIL,MOTRIN) 600 MG tablet Take 600 mg by mouth every 6 (six) hours as needed for moderate pain.   Yes Historical Provider, MD  oxyCODONE-acetaminophen (PERCOCET/ROXICET) 5-325 MG per tablet Take 2 tablets by mouth every 4 (four) hours as needed for severe pain. 08/27/13  Yes Kaitlyn Szekalski, PA-C   BP 116/62  Pulse 80  Temp(Src) 98.7 F (37.1 C) (Oral)  Resp 18  SpO2 99%  Physical Exam  Nursing note and vitals reviewed. Constitutional: He is oriented to person, place, and time. He appears well-developed and well-nourished. No distress.  HENT:  Head: Normocephalic and atraumatic.  Mouth/Throat: Oropharynx is clear and moist. No oropharyngeal exudate.  Negative trismus  Eyes: Conjunctivae and EOM are normal. Pupils are equal, round, and reactive to light. Right eye exhibits no discharge. Left eye  exhibits no discharge.  Negative nystagmus Visual fields grossly intact  Neck: Normal range of motion. Neck supple. No tracheal deviation present.  Cardiovascular: Normal rate, regular rhythm and normal heart sounds.  Exam reveals no friction rub.   No murmur heard. Pulses:      Radial pulses are 2+ on the right side, and 2+ on the left side.  Pulmonary/Chest: Effort normal and breath sounds normal. No respiratory distress. He has no wheezes. He has no rales.  Patient is able to speak in full sentences without difficulty Negative use of accessory muscles Negative stridor  Musculoskeletal:  Normal range of motion.  Lymphadenopathy:    He has no cervical adenopathy.  Neurological: He is alert and oriented to person, place, and time. No cranial nerve deficit. He exhibits normal muscle tone. Coordination normal.  Cranial nerves III through XII grossly intact  Skin: Skin is warm and dry.  3 cm x 2 cm fluctuant abscess identified to the right TMJ region, just below the lobe of the right ear. Surrounding cellulitis identified. Discomfort upon palpation. Negative active drainage or bleeding noted.  Psychiatric: He has a normal mood and affect. His behavior is normal.    ED Course  Procedures (including critical care time)  DIAGNOSTIC STUDIES: Oxygen Saturation is 99% on RA, normal by my interpretation.    COORDINATION OF CARE: 3:23 PM-Discussed treatment plan which includes I&D with pt at bedside and pt agreed to plan.   3:50 PM-Abscess I&D'd. Pt will be discharged home with another round of clindamycin and a short course of pain medication. Advised pt to follow up with ENT.    INCISION AND DRAINAGE Performed by: Suzi RootsKevin E Steinl, MD Consent: Verbal consent obtained. Risks and benefits: risks, benefits and alternatives were discussed Type: abscess Body area: right ear Anesthesia: local infiltration Incision was made with a scalpel. Local anesthetic: lidocaine 2% without epinephrine Anesthetic total: 5 ml Complexity: complex Blunt dissection to break up loculations Drainage: purulent Drainage amount: copious Packing material: 1/4 in iodoform gauze Patient tolerance: Patient tolerated the procedure well with no immediate complications.   Labs Review Labs Reviewed - No data to display  Imaging Review No results found.   EKG Interpretation None      MDM   Final diagnoses:  Abscess  Cellulitis    Filed Vitals:   08/31/13 1354  BP: 116/62  Pulse: 80  Temp: 98.7 F (37.1 C)  TempSrc: Oral  Resp: 18  SpO2: 99%    I personally performed the services  described in this documentation, which was scribed in my presence. The recorded information has been reviewed and is accurate.  Patient presenting to the ED with abscess localized to the right TMJ, just below the right lobe. Reported that he was seen and assessed in ED setting a couple of days ago where the abscess was drained-reported that has gotten larger and progressively worse. Stated he is unable to keep, he is unable to sleep. Stated he's use the clindamycin and pain medications as prescribed minimally. Alert and oriented. GCS 15. Heart rate and rhythm normal. Lungs clear to auscultation to upper and lower lobes bilaterally. Radial pulses 2+. Approximately 3 cm x 2.5 cm fluctuant abscess identified to the right TMJ just below the lobe of the right ear. Fluctuance and discomfort upon palpation. Negative active drainage. Surrounding cellulitis identified. Negative TMJ. Poor dentition identified. Uvula midline with symmetrical elevation. Negative stridor. Patient stated to speak in full senses without difficulty. Abscess I&D by the assistance of  Dr. Leeann Must with copious amounts of purulent fluid. Patient tolerated procedure well. Abscess packed with packing. Discussed care with patient. Successful I&D with purulent fluid discharge. Patient tolerated procedure well. Patient stable, afebrile. Patient hemodynamically stable. Discharged patient. Discharge patient with recommendations to [facility] and ENT. [place] with patient proper wound care. Discussed with patient to remove packing within 2 days. Discussed with patient to closely monitor symptoms and if symptoms are to worsen or change to report back to the ED - strict return instructions given.  Patient agreed to plan of care, understood, all questions answered.   Raymon Mutton, PA-C 08/31/13 1635

## 2013-08-31 NOTE — ED Notes (Signed)
Pt states his pain is getting worse and states when they cut his abscess open last time they did not numb it first. Pt requests to be numbed 1st.

## 2013-08-31 NOTE — Progress Notes (Signed)
[facility] provided pt with a list of primary care resources, to help patient establish primary care.  °

## 2013-08-31 NOTE — ED Provider Notes (Signed)
Medical screening examination/treatment/procedure(s) were conducted as a shared visit with non-physician practitioner(s) and myself.  I personally evaluated the patient during the encounter.  Pt with recurrent abscess right ear/preauricular area.  +fluctance/tender.  I incised and drained, large amount pus removed. Given recurrent nature abscess, mild surrounding erythema, will place on abx, recheck wound, and facilitate ent f/u.   Suzi RootsKevin E Cheresa Siers, MD 08/31/13 [phone]

## 2013-09-01 NOTE — ED Provider Notes (Signed)
11:48 AM This provider spoke with the patient via telephone - verified by DOB, name. Patient reported that he is feeling much better since the abscess was lanced. Patient reported that he has noticed some drainage coming from the site. Reported that he has been taking his antibiotics as prescribed. Discussed with patient to finish his last 5 days of the clindamycin that he was originally discharged with on 08/27/2013, not to fill the second prescription - patient verbalized understanding. Recommended patient to come to the ED tomorrow for the wound to be re-assessed and for packing to be removed - patient verbalized understanding. Highly encouraged patient to set-up a follow-up appointment with ENT - patient agreed and reported that he will. Discussed course of pain medications, precautions and dangers with use as well - patient understood. Discussed with patient to closely monitor symptoms and if symptoms are to worsen or change to report back to the ED - strict return instructions given.  Patient agreed to plan of care, understood, all questions answered.   Dwayne MuttonMarissa Meleny Tregoning, PA-C 09/01/13 1151

## 2013-09-02 ENCOUNTER — Emergency Department (HOSPITAL_COMMUNITY)
Admission: EM | Admit: 2013-09-02 | Discharge: 2013-09-02 | Disposition: A | Discharge status: Home / Self Care | Payer: No Typology Code available for payment source | Attending: Emergency Medicine | Admitting: Emergency Medicine

## 2013-09-02 ENCOUNTER — Encounter (HOSPITAL_COMMUNITY): Payer: Self-pay | Admitting: Emergency Medicine

## 2013-09-02 DIAGNOSIS — H60399 Other infective otitis externa, unspecified ear: Secondary | ICD-10-CM | POA: Insufficient documentation

## 2013-09-02 DIAGNOSIS — L0201 Cutaneous abscess of face: Secondary | ICD-10-CM

## 2013-09-02 DIAGNOSIS — Z8659 Personal history of other mental and behavioral disorders: Secondary | ICD-10-CM | POA: Insufficient documentation

## 2013-09-02 DIAGNOSIS — F17200 Nicotine dependence, unspecified, uncomplicated: Secondary | ICD-10-CM | POA: Insufficient documentation

## 2013-09-02 DIAGNOSIS — Z792 Long term (current) use of antibiotics: Secondary | ICD-10-CM | POA: Insufficient documentation

## 2013-09-02 NOTE — ED Notes (Signed)
Pt requesting that packing be removed from r/side of face. I and D 2 days ago

## 2013-09-02 NOTE — ED Notes (Signed)
Wound care completed.

## 2013-09-02 NOTE — ED Provider Notes (Signed)
Medical screening examination/treatment/procedure(s) were performed by non-physician practitioner and as supervising physician I was immediately available for consultation/collaboration.   Klani Caridi E Star Cheese, MD 09/02/13 1907 

## 2013-09-02 NOTE — Discharge Instructions (Signed)
Read the information below.  You may return to the Emergency Department at any time for worsening condition or any new symptoms that concern you.  Take the antibiotics until they are gone.  If you develop redness, swelling, increased pus draining from the wound, increased pain, or fevers greater than 100.4, return to the ER immediately for a recheck.     Abscess Care After An abscess (also called a boil or furuncle) is an infected area that contains a collection of pus. Signs and symptoms of an abscess include pain, tenderness, redness, or hardness, or you may feel a moveable soft area under your skin. An abscess can occur anywhere in the body. The infection may spread to surrounding tissues causing cellulitis. A cut (incision) by the surgeon was made over your abscess and the pus was drained out. Gauze may have been packed into the space to provide a drain that will allow the cavity to heal from the inside outwards. The boil may be painful for 5 to 7 days. Most people with a boil do not have high fevers. Your abscess, if seen early, may not have localized, and may not have been lanced. If not, another appointment may be required for this if it does not get better on its own or with medications. HOME CARE INSTRUCTIONS   Only take over-the-counter or prescription medicines for pain, discomfort, or fever as directed by your caregiver.  When you bathe, soak and then remove gauze or iodoform packs at least daily or as directed by your caregiver. You may then wash the wound gently with mild soapy water. Repack with gauze or do as your caregiver directs. SEEK IMMEDIATE MEDICAL CARE IF:   You develop increased pain, swelling, redness, drainage, or bleeding in the wound site.  You develop signs of generalized infection including muscle aches, chills, fever, or a general ill feeling.  An oral temperature above 102 F (38.9 C) develops, not controlled by medication. See your caregiver for a recheck if you  develop any of the symptoms described above. If medications (antibiotics) were prescribed, take them as directed. Document Released: 11/15/2004 Document Revised: 07/22/2011 Document Reviewed: 07/13/2007 [facility]Exit[facility] Patient Information 2014 [place], [place].  Abscess An abscess (boil or furuncle) is an infected area on or under the skin. This area is filled with yellowish-white fluid (pus) and other material (debris). HOME CARE   Only take medicines as told by your doctor.  If you were given antibiotic medicine, take it as directed. Finish the medicine even if you start to feel better.  If gauze is used, follow your doctor's directions for changing the gauze.  To avoid spreading the infection:  Keep your abscess covered with a bandage.  Wash your hands well.  Do not share personal care items, towels, or whirlpools with others.  Avoid skin contact with others.  Keep your skin and clothes clean around the abscess.  Keep all doctor visits as told. GET HELP RIGHT AWAY IF:   You have more pain, puffiness (swelling), or redness in the wound site.  You have more fluid or blood coming from the wound site.  You have muscle aches, chills, or you feel sick.  You have a fever. MAKE SURE YOU:   Understand these instructions.  Will watch your condition.  Will get help right away if you are not doing well or get worse. Document Released: 10/16/2007 Document Revised: 10/29/2011 Document Reviewed: 07/12/2011 [facility]ExitCare Patient Information 2014 [place], [place].

## 2013-09-02 NOTE — ED Provider Notes (Signed)
CSN: [id number]     Arrival date & time 09/02/13  1325 History  This chart was scribed for non-physician practitioner working with Shanna CiscoMegan E Docherty, MD by Ashley JacobsBrittany Andrews, ED scribe. This patient was seen in room WTR5/WTR5 and the patient's care was started at 1:50 PM.   First MD Initiated Contact with Patient 09/02/13 1342     Chief Complaint  Patient presents with  . Wound Check    packing removal from r/face     (Consider location/radiation/quality/duration/timing/severity/associated sxs/prior Treatment) Wound Check  HPI HPI Comments: Dwayne Drake is a 33 y.o. male who presents to the Emergency Department complaining of right ear abscess. He was seen at the ED 4/15 for a right ear abscess. On 4/17 had an I&D performed and was given clindamycin. Rx. H had another I&D performed 4/21.  States the area has become much improved.  Reports pain is improving.  Does have some continued discharge.  He continues taking the antibiotics.  Denies any worsening of his symptoms. Pt was referred to the ENT however he did not follow up with them. Pt received a call yesterday from PA [place] informing him to continued clindamcyin and return for packing removal today (please see all prior notes for full details).    Past Medical History  Diagnosis Date  . Depression    Past Surgical History  Procedure Laterality Date  . Gallbladder surgery  2002  . Cholecystectomy     Family History  Problem Relation Age of Onset  . Cancer Neg Hx   . Alcohol abuse Neg Hx   . Diabetes Neg Hx   . Drug abuse Neg Hx   . Depression Neg Hx   . Heart disease Neg Hx    History  Substance Use Topics  . Smoking status: Current Every Day Smoker -- 2.00 packs/day for 15 years    Types: Cigarettes  . Smokeless tobacco: Never Used  . Alcohol Use: 9.0 oz/week    15 Cans of beer per week    Review of Systems  Constitutional: Negative for fever and chills.  HENT: Positive for ear pain.   Gastrointestinal:  Negative for nausea and vomiting.  Skin:       abscess to his right ear  Allergic/Immunologic: Negative for immunocompromised state.  Hematological: Does not bruise/bleed easily.  All other systems reviewed and are negative.     Allergies  Sulfa drugs cross reactors  Home Medications   Prior to Admission medications   Medication Sig Start Date End Date Taking? Authorizing Provider  clindamycin (CLEOCIN) 150 MG capsule Take 450 mg by mouth 4 (four) times daily. 08/27/13  Yes Marissa [place], PA-C  oxyCODONE-acetaminophen (PERCOCET/ROXICET) 5-325 MG per tablet Take 1 tablet by mouth every 8 (eight) hours as needed for severe pain. 08/31/13  Yes Marissa [place], PA-C   BP 108/78  Pulse 87  Temp(Src) 98 F (36.7 C) (Oral)  Resp 18  SpO2 99% Physical Exam  Nursing note and vitals reviewed. Constitutional: He appears well-developed and well-nourished. No distress.  HENT:  Head: Normocephalic and atraumatic.    Neck: Neck supple.  Pulmonary/Chest: Effort normal.  Neurological: He is alert.  Skin: He is not diaphoretic.    ED Course  Procedures (including critical care time) DIAGNOSTIC STUDIES: Oxygen Saturation is 99% on room air, normal by my interpretation.    COORDINATION OF CARE:   1:54 PM Discussed course of care with pt . Pt understands and agrees.    Labs Review Labs Reviewed -  No data to display  Imaging Review No results found.   EKG Interpretation None        MDM   Final diagnoses:  Facial abscess    Pt with I&Dx2 of facial abscess returns for wound check and packing removal.  Packing removed by Collene LeydenWaqas Tariq, PA-S, under my direct supervision.  Wound assesses, continued small amount of purulent drainage.  Mildly tender. No overlying cellulitis.  He is afebrile, nontoxic, reporting great improvement.  Pt encouraged to continue antibiotics as prescribed until they are gone.  Discussed home care. Given ENT follow up as pt was unaware of prior referral.   Discussed result, findings, treatment, and follow up  with patient.  Pt given return precautions.  Pt verbalizes understanding and agrees with plan.        Trixie Dredgemily Zaydn Gutridge, PA-C 09/02/13 1434

## 2013-09-03 NOTE — ED Provider Notes (Signed)
Medical screening examination/treatment/procedure(s) were conducted as a shared visit with non-physician practitioner(s) and myself.  I personally evaluated the patient during the encounter.   EKG Interpretation None        Suzi RootsKevin E Allean Montfort, MD 09/03/13 1151

## 2014-03-01 ENCOUNTER — Encounter (HOSPITAL_COMMUNITY): Payer: Self-pay | Admitting: Emergency Medicine

## 2014-03-01 ENCOUNTER — Emergency Department (HOSPITAL_COMMUNITY)
Admission: EM | Admit: 2014-03-01 | Discharge: 2014-03-01 | Disposition: A | Discharge status: Home / Self Care | Payer: Worker's Compensation | Attending: Emergency Medicine | Admitting: Emergency Medicine

## 2014-03-01 ENCOUNTER — Emergency Department (HOSPITAL_COMMUNITY): Payer: Worker's Compensation

## 2014-03-01 DIAGNOSIS — Z792 Long term (current) use of antibiotics: Secondary | ICD-10-CM | POA: Diagnosis not present

## 2014-03-01 DIAGNOSIS — Y9289 Other specified places as the place of occurrence of the external cause: Secondary | ICD-10-CM | POA: Insufficient documentation

## 2014-03-01 DIAGNOSIS — S20229A Contusion of unspecified back wall of thorax, initial encounter: Secondary | ICD-10-CM

## 2014-03-01 DIAGNOSIS — Z720 Tobacco use: Secondary | ICD-10-CM | POA: Insufficient documentation

## 2014-03-01 DIAGNOSIS — Z8659 Personal history of other mental and behavioral disorders: Secondary | ICD-10-CM | POA: Insufficient documentation

## 2014-03-01 DIAGNOSIS — M549 Dorsalgia, unspecified: Secondary | ICD-10-CM

## 2014-03-01 DIAGNOSIS — W208XXA Other cause of strike by thrown, projected or falling object, initial encounter: Secondary | ICD-10-CM | POA: Diagnosis not present

## 2014-03-01 DIAGNOSIS — Y990 Civilian activity done for income or pay: Secondary | ICD-10-CM | POA: Insufficient documentation

## 2014-03-01 DIAGNOSIS — Y9389 Activity, other specified: Secondary | ICD-10-CM | POA: Insufficient documentation

## 2014-03-01 DIAGNOSIS — S300XXA Contusion of lower back and pelvis, initial encounter: Secondary | ICD-10-CM | POA: Diagnosis not present

## 2014-03-01 DIAGNOSIS — S3992XA Unspecified injury of lower back, initial encounter: Secondary | ICD-10-CM | POA: Diagnosis present

## 2014-03-01 MED ORDER — IBUPROFEN 800 MG PO TABS: 800.0000 mg | ORAL_TABLET | Freq: Three times a day (TID) | ORAL | Status: DC

## 2014-03-01 MED ORDER — HYDROCODONE-ACETAMINOPHEN 5-325 MG PO TABS
1.0000 | ORAL_TABLET | Freq: Once | ORAL | Status: AC
  Administered 2014-03-01: 1 via ORAL
  Filled 2014-03-01: qty 1

## 2014-03-01 MED ORDER — TRAMADOL HCL 50 MG PO TABS
50.0000 mg | ORAL_TABLET | Freq: Four times a day (QID) | ORAL | Status: DC | PRN
Start: 2014-03-01 — End: 2016-09-11

## 2014-03-01 NOTE — ED Provider Notes (Signed)
Medical screening examination/treatment/procedure(s) were performed by non-physician practitioner and as supervising physician I was immediately available for consultation/collaboration.   EKG Interpretation None        Courtney F Horton, MD 03/01/14 1900 

## 2014-03-01 NOTE — Discharge Instructions (Signed)
X-ray is normal. Ibuprofen for pain. norco for severe pain. Follow up with your doctor. Return if worsening symptoms.    Contusion A contusion is a deep bruise. Contusions are the result of an injury that caused bleeding under the skin. The contusion may turn blue, purple, or yellow. Minor injuries will give you a painless contusion, but more severe contusions may stay painful and swollen for a few weeks.  CAUSES  A contusion is usually caused by a blow, trauma, or direct force to an area of the body. SYMPTOMS   Swelling and redness of the injured area.  Bruising of the injured area.  Tenderness and soreness of the injured area.  Pain. DIAGNOSIS  The diagnosis can be made by taking a history and physical exam. An X-ray, CT scan, or MRI may be needed to determine if there were any associated injuries, such as fractures. TREATMENT  Specific treatment will depend on what area of the body was injured. In general, the best treatment for a contusion is resting, icing, elevating, and applying cold compresses to the injured area. Over-the-counter medicines may also be recommended for pain control. Ask your caregiver what the best treatment is for your contusion. HOME CARE INSTRUCTIONS   Put ice on the injured area.  Put ice in a plastic bag.  Place a towel between your skin and the bag.  Leave the ice on for 15-20 minutes, 3-4 times a day, or as directed by your health care provider.  Only take over-the-counter or prescription medicines for pain, discomfort, or fever as directed by your caregiver. Your caregiver may recommend avoiding anti-inflammatory medicines (aspirin, ibuprofen, and naproxen) for 48 hours because these medicines may increase bruising.  Rest the injured area.  If possible, elevate the injured area to reduce swelling. SEEK IMMEDIATE MEDICAL CARE IF:   You have increased bruising or swelling.  You have pain that is getting worse.  Your swelling or pain is not  relieved with medicines. MAKE SURE YOU:   Understand these instructions.  Will watch your condition.  Will get help right away if you are not doing well or get worse. Document Released: 02/06/2005 Document Revised: 05/04/2013 Document Reviewed: 03/04/2011 [facility]Exit[facility] Patient Information 2015 [place], [place]LL[place]. This information is not intended to replace advice given to you by your health care provider. Make sure you discuss any questions you have with your health care provider.

## 2014-03-01 NOTE — ED Provider Notes (Signed)
CSN: [id number]     Arrival date & time 03/01/14  [id number] History  This chart was scribed for Dwayne Drake A Azell Bill, PA with Shon Batonourtney F Horton, MD by Tonye RoyaltyJoshua Chen, ED Scribe. This patient was seen in room WTR7/WTR7 and the patient's care was started at 2:36 PM.    Chief Complaint  Patient presents with  . Back Pain   The history is provided by the patient. No language interpreter was used.    HPI Comments: Lucy AntiguaDavid K Palomino is a 33 y.o. male who presents to the Emergency Department complaining of back pain after a 10-15 pound pipe fell on his back yesterday at work. He states pain has been gradually increasing. Pain radiating up the back. He states that when he breathes deeply, he gets a sharp pain to his back and shoulder. He reports using Ibuprofen and heating pad without remission of symptoms. He denies pain to his legs. He denies numbness, weakness, incontinence, or fever.  Past Medical History  Diagnosis Date  . Depression    Past Surgical History  Procedure Laterality Date  . Gallbladder surgery  2002  . Cholecystectomy     Family History  Problem Relation Age of Onset  . Cancer Neg Hx   . Alcohol abuse Neg Hx   . Diabetes Neg Hx   . Drug abuse Neg Hx   . Depression Neg Hx   . Heart disease Neg Hx    History  Substance Use Topics  . Smoking status: Current Every Day Smoker -- 1.00 packs/day for 15 years    Types: Cigarettes  . Smokeless tobacco: Never Used  . Alcohol Use: 9.0 oz/week    15 Cans of beer per week    Review of Systems  Constitutional: Negative for fever.  Musculoskeletal: Positive for back pain (radiating to shoulder).  Neurological: Negative for weakness and numbness.       Denies incontinence      Allergies  Sulfa drugs cross reactors  Home Medications   Prior to Admission medications   Medication Sig Start Date End Date Taking? Authorizing Provider  clindamycin (CLEOCIN) 150 MG capsule Take 450 mg by mouth 4 (four) times daily. For 8 days.  08/27/13   Marissa Sciacca, PA-C  oxyCODONE-acetaminophen (PERCOCET/ROXICET) 5-325 MG per tablet Take 1 tablet by mouth every 8 (eight) hours as needed for severe pain. 08/31/13   Marissa Sciacca, PA-C   BP 124/76  Pulse 60  Temp(Src) 97.6 F (36.4 C) (Oral)  Resp 16  SpO2 96% Physical Exam  Nursing note and vitals reviewed. Constitutional: He is oriented to person, place, and time. He appears well-developed and well-nourished.  HENT:  Head: Normocephalic and atraumatic.  Eyes: Conjunctivae are normal.  Neck: Normal range of motion. Neck supple.  Pulmonary/Chest: Effort normal.  Musculoskeletal: Normal range of motion.  Contusion noted across lumbar area. Tender to palpation over midline lumbar spine and bilateral paraspinal muscles.   Neurological: He is alert and oriented to person, place, and time.  5/5 and equal lower extremity strength. 2+ and equal patellar reflexes bilaterally. Pt able to dorsiflex bilateral toes and feet with good strength against resistance. Equal sensation bilaterally over thighs and lower legs.   Skin: Skin is warm and dry.  Psychiatric: He has a normal mood and affect.    ED Course  Procedures (including critical care time) Labs Review Labs Reviewed - No data to display  Imaging Review No results found.   EKG Interpretation None     DIAGNOSTIC STUDIES:  Oxygen Saturation is 96% on room air, normal by my interpretation.    COORDINATION OF CARE: 2:39 PM Discussed treatment plan with patient at beside, the patient agrees with the plan and has no further questions at this time.   MDM   Final diagnoses:  Back contusion, unspecified laterality, initial encounter    Patient is a contusion over his lumbar back, after a bar fell and hit him on the back yesterday. No signs of cauda equina. He is tender to the midline lumbar spine and bilateral paraspinal muscles. I suspect this is a contusion. X-rays obtained to rule out a spinous process fractures  and are negative. Will treat with ibuprofen, Ultram, follow with primary care Dr. as needed. Discussed precautions that should prompt his return.   Filed Vitals:   03/01/14 1415  BP: 124/76  Pulse: 60  Temp: 97.6 F (36.4 C)  TempSrc: Oral  Resp: 16  SpO2: 96%    I personally performed the services described in this documentation, which was scribed in my presence. The recorded information has been reviewed and is accurate.    Dwayne Drake A Dwayne Tavano, PA-C 03/01/14 [id number]

## 2014-03-01 NOTE — ED Notes (Signed)
Pt c/o lower back pain after a pipe fell at hit him on the back at work yesterday.  Pain score 8/10.  Pt ambulated into departments.  Denies numbness and tingling.

## 2014-08-25 ENCOUNTER — Ambulatory Visit (INDEPENDENT_AMBULATORY_CARE_PROVIDER_SITE_OTHER): Payer: Self-pay | Admitting: Family Medicine

## 2014-08-25 VITALS — BP 110/70 | HR 78 | Wt 141.0 lb

## 2014-08-25 DIAGNOSIS — R1084 Generalized abdominal pain: Secondary | ICD-10-CM

## 2014-08-25 DIAGNOSIS — K429 Umbilical hernia without obstruction or gangrene: Secondary | ICD-10-CM

## 2014-08-25 NOTE — Progress Notes (Signed)
   Subjective:    Patient ID: Dwayne Drake, male    DOB: 1981/01/29, 34 y.o.   MRN: [id number]  HPI He is here for evaluation of abdominal pain and cramping. He states it started 2 months ago with no provocation. After that it did tend to go away however his job requires lifting and he notes when he lifts, he would then get cramping abdominal pain. The pain can be made less severe with compression of his stomach. He's had no nausea or vomiting, his bowel habits have been regular but loose. He states that they have been loose and gallbladder surgery at age 34. He did go to an urgent care center and was told he had hernias and also apparently urinary tract infection. They gave him Cipro and ibuprofen. He continues to have abdominal cramping especially with physical activity.   Review of Systems     Objective:   Physical Exam Alert and in no distress. Abdominal exam shows active bowel sounds with a very small umbilical hernia that is easily reduced. Slight tenderness in the left mid quadrant. Genital exam shows no hernia.       Assessment & Plan:  Umbilical hernia without obstruction and without gangrene  Generalized abdominal pain explained that I do not have a good reason behind his symptoms. It is interesting that the pain is lessened with abdominal pressure. I will get his x-rays and blood work from the urgent care and [place] before further evaluation.

## 2014-08-30 ENCOUNTER — Telehealth: Payer: Self-pay | Admitting: Family Medicine

## 2014-08-30 NOTE — Telephone Encounter (Signed)
He needs to be scheduled for an appointment.

## 2014-08-30 NOTE — Telephone Encounter (Signed)
Pt called to see if he could get meds for pain now since he is in a lot of pain.Did prior office send all the records that was needed so that we could move forward with treatment?

## 2014-08-30 NOTE — Telephone Encounter (Signed)
Pt being seen thursday

## 2014-08-31 ENCOUNTER — Encounter: Payer: Self-pay | Admitting: Family Medicine

## 2014-09-01 ENCOUNTER — Encounter: Payer: Self-pay | Admitting: Family Medicine

## 2014-09-01 ENCOUNTER — Ambulatory Visit (INDEPENDENT_AMBULATORY_CARE_PROVIDER_SITE_OTHER): Payer: Self-pay | Admitting: Family Medicine

## 2014-09-01 VITALS — BP 102/70 | HR 72 | Temp 98.1°F | Wt 136.6 lb

## 2014-09-01 DIAGNOSIS — R1084 Generalized abdominal pain: Secondary | ICD-10-CM

## 2014-09-01 DIAGNOSIS — R195 Other fecal abnormalities: Secondary | ICD-10-CM

## 2014-09-01 LAB — CBC WITH DIFFERENTIAL/PLATELET
BASOS ABS: 0 10*3/uL (ref 0.0–0.1)
Basophils Relative: 0 % (ref 0–1)
EOS ABS: 0.1 10*3/uL (ref 0.0–0.7)
EOS PCT: 1 % (ref 0–5)
HCT: 44.4 % (ref 39.0–52.0)
Hemoglobin: 15.1 g/dL (ref 13.0–17.0)
Lymphocytes Relative: 24 % (ref 12–46)
Lymphs Abs: 3.1 10*3/uL (ref 0.7–4.0)
MCH: 29 pg (ref 26.0–34.0)
MCHC: 34 g/dL (ref 30.0–36.0)
MCV: 85.2 fL (ref 78.0–100.0)
MPV: 11.5 fL (ref 8.6–12.4)
Monocytes Absolute: 0.6 10*3/uL (ref 0.1–1.0)
Monocytes Relative: 5 % (ref 3–12)
Neutro Abs: 9 10*3/uL — ABNORMAL HIGH (ref 1.7–7.7)
Neutrophils Relative %: 70 % (ref 43–77)
PLATELETS: 265 10*3/uL (ref 150–400)
RBC: 5.21 MIL/uL (ref 4.22–5.81)
RDW: 14.1 % (ref 11.5–15.5)
WBC: 12.8 10*3/uL — AB (ref 4.0–10.5)

## 2014-09-01 LAB — HEMOCCULT GUIAC POC 1CARD (OFFICE)

## 2014-09-01 NOTE — Progress Notes (Signed)
   Subjective:    Patient ID: Dwayne Drake, male    DOB: [birth year], 34 y.o.   MRN: [id number]  HPI He is here for reevaluation. He continues to complain of abdominal pain. Previously emergency room record was reviewed. He states that when he holds his abdomen, it does relieve the pain. Motion of any kind causes difficulty. He has had no difficulty with eating. Pain is also in the inguinal area. Food and alcohol have no effect on this. He has had no nausea, vomiting or diarrhea.  Review of Systems     Objective:   Physical Exam Cardiac and lung exam normal. Abdominal exam shows generalized tenderness but no true rebound or point tenderness. Rectal exam was guaiac positive.       Assessment & Plan:  Generalized abdominal pain - Plan: CBC with Differential/Platelet, Comprehensive metabolic panel, Amylase, Lipase  Stool guaiac positive - Plan: CBC with Differential/Platelet, Comprehensive metabolic panel, Amylase, Lipase no good reason why he is having this particular problem. I will await the blood results and probably refer to GI.

## 2014-09-02 ENCOUNTER — Encounter: Payer: Self-pay | Admitting: Nurse Practitioner

## 2014-09-02 ENCOUNTER — Other Ambulatory Visit: Payer: Self-pay | Admitting: *Deleted

## 2014-09-02 DIAGNOSIS — D72829 Elevated white blood cell count, unspecified: Secondary | ICD-10-CM

## 2014-09-02 LAB — COMPREHENSIVE METABOLIC PANEL
ALK PHOS: 59 U/L (ref 39–117)
ALT: 19 U/L (ref 0–53)
AST: 22 U/L (ref 0–37)
Albumin: 4.4 g/dL (ref 3.5–5.2)
BUN: 17 mg/dL (ref 6–23)
CO2: 23 mEq/L (ref 19–32)
Calcium: 9.8 mg/dL (ref 8.4–10.5)
Chloride: 104 mEq/L (ref 96–112)
Creat: 0.93 mg/dL (ref 0.50–1.35)
Glucose, Bld: 115 mg/dL — ABNORMAL HIGH (ref 70–99)
POTASSIUM: 4.3 meq/L (ref 3.5–5.3)
SODIUM: 138 meq/L (ref 135–145)
TOTAL PROTEIN: 7.1 g/dL (ref 6.0–8.3)
Total Bilirubin: 0.4 mg/dL (ref 0.2–1.2)

## 2014-09-02 LAB — AMYLASE: Amylase: 52 U/L (ref 0–105)

## 2014-09-02 LAB — LIPASE: Lipase: 74 U/L (ref 0–75)

## 2014-09-13 ENCOUNTER — Ambulatory Visit: Payer: Self-pay | Admitting: Nurse Practitioner

## 2016-09-11 ENCOUNTER — Encounter (HOSPITAL_COMMUNITY): Payer: Self-pay | Admitting: *Deleted

## 2016-09-11 ENCOUNTER — Emergency Department (HOSPITAL_COMMUNITY)
Admission: EM | Admit: 2016-09-11 | Discharge: 2016-09-11 | Disposition: A | Discharge status: Home / Self Care | Payer: PRIVATE HEALTH INSURANCE | Attending: Emergency Medicine | Admitting: Emergency Medicine

## 2016-09-11 ENCOUNTER — Emergency Department (HOSPITAL_COMMUNITY): Payer: PRIVATE HEALTH INSURANCE

## 2016-09-11 DIAGNOSIS — Z79899 Other long term (current) drug therapy: Secondary | ICD-10-CM | POA: Insufficient documentation

## 2016-09-11 DIAGNOSIS — Z7982 Long term (current) use of aspirin: Secondary | ICD-10-CM | POA: Insufficient documentation

## 2016-09-11 DIAGNOSIS — R519 Headache, unspecified: Secondary | ICD-10-CM

## 2016-09-11 DIAGNOSIS — R51 Headache: Secondary | ICD-10-CM | POA: Insufficient documentation

## 2016-09-11 DIAGNOSIS — F17210 Nicotine dependence, cigarettes, uncomplicated: Secondary | ICD-10-CM | POA: Insufficient documentation

## 2016-09-11 MED ORDER — SODIUM CHLORIDE 0.9 % IV BOLUS (SEPSIS)
1000.0000 mL | Freq: Once | INTRAVENOUS | Status: AC
  Administered 2016-09-11: 1000 mL via INTRAVENOUS

## 2016-09-11 MED ORDER — BUTALBITAL-APAP-CAFFEINE 50-325-40 MG PO TABS
1.0000 | ORAL_TABLET | Freq: Four times a day (QID) | ORAL | 0 refills | Status: AC | PRN
Start: 2016-09-11 — End: 2017-09-11

## 2016-09-11 MED ORDER — DIPHENHYDRAMINE HCL 50 MG/ML IJ SOLN
25.0000 mg | Freq: Once | INTRAMUSCULAR | Status: AC
  Administered 2016-09-11: 25 mg via INTRAVENOUS
  Filled 2016-09-11: qty 1

## 2016-09-11 MED ORDER — METOCLOPRAMIDE HCL 5 MG/ML IJ SOLN
10.0000 mg | Freq: Once | INTRAMUSCULAR | Status: AC
  Administered 2016-09-11: 10 mg via INTRAVENOUS
  Filled 2016-09-11: qty 2

## 2016-09-11 NOTE — ED Triage Notes (Signed)
Pt complains of migraines for the past several weeks, which have gotten progressively worse. Pain is worse in morning, with sensitivity to light. Excedrin is not providing relief.

## 2016-09-11 NOTE — Discharge Instructions (Signed)
Take the prescribed medication as directed if headache is severe.  If mild you can use tylenol, motrin, excedrin, etc. Follow-up with your primary care doctor.  You may also see a neurologist if you continue having issues with ongoing headaches. Return to the ED for new or worsening symptoms.

## 2016-09-11 NOTE — ED Provider Notes (Signed)
[facility]-EMERGENCY DEPT Provider Note   CSN: [id number] Arrival date & time: 09/11/16  1456     History   Chief Complaint Chief Complaint  Patient presents with  . Migraine    HPI Dwayne Drake is a 36 y.o. male.  The history is provided by the patient and medical records.  Migraine  Associated symptoms include headaches.    36 year old male with history of depression, presenting to the ED for headaches. Reports this is been ongoing for the past several weeks. States headache waxes and wanes in severity but it has never fully gone away since onset. Reports pain is localized across his forehead, described as pulsatile and throbbing. He has associated photophobia and some lightheadedness when pain is severe. He does report headaches seem worse in the morning and decreased somewhat during the day but will get severe again at night. States headaches are better when laying flat, worse with sitting upright. He has not noticed any fever or chills.  No neck pain or stiffness.  No focal numbness or weakness, visual disturbance, gait disturbance, confusion, or changes in speech. He is not currently on anticoagulation. No recent head injury or trauma. States he has no formal diagnosis of migraines, however his mother suffers with migraines that he suspected this is what he has. He has been taking Excedrin migraine but has not had any noted relief with this. This is his first evaluation for headaches.  Past Medical History:  Diagnosis Date  . Depression     Patient Active Problem List   Diagnosis Date Noted  . Chest pain at rest 10/10/2011  . Anxiety and depression 10/10/2011  . Tobacco abuse disorder 05/20/2011    Past Surgical History:  Procedure Laterality Date  . CHOLECYSTECTOMY    . GALLBLADDER SURGERY  2002       Home Medications    Prior to Admission medications   Medication Sig Start Date End Date Taking? Authorizing Provider  Aspirin-Acetaminophen-Caffeine (EXCEDRIN  MIGRAINE PO) Take 3 tablets by mouth every 6 (six) hours as needed (migraine).   Yes Historical Provider, MD  ibuprofen (ADVIL,MOTRIN) 200 MG tablet Take 400 mg by mouth every 8 (eight) hours as needed for headache.   Yes Historical Provider, MD    Family History Family History  Problem Relation Age of Onset  . Cancer Neg Hx   . Alcohol abuse Neg Hx   . Diabetes Neg Hx   . Drug abuse Neg Hx   . Depression Neg Hx   . Heart disease Neg Hx     Social History Social History  Substance Use Topics  . Smoking status: Current Every Day Smoker    Packs/day: 1.00    Years: 15.00    Types: Cigarettes  . Smokeless tobacco: Never Used  . Alcohol use 9.0 oz/week    15 Cans of beer per week     Allergies   Sulfa drugs cross reactors   Review of Systems Review of Systems  Eyes: Positive for photophobia.  Neurological: Positive for headaches.  All other systems reviewed and are negative.    Physical Exam Updated Vital Signs BP (!) 124/99 (BP Location: Left Arm)   Pulse (!) 135   Temp 98 F (36.7 C) (Oral)   Resp 18   SpO2 98%   Physical Exam  Constitutional: He is oriented to person, place, and time. He appears well-developed and well-nourished. No distress.  HENT:  Head: Normocephalic and atraumatic.  Right Ear: External ear normal.  Left Ear:  External ear normal.  Mouth/Throat: Oropharynx is clear and moist.  Eyes: Conjunctivae and EOM are normal. Pupils are equal, round, and reactive to light.  Neck: Normal range of motion and full passive range of motion without pain. Neck supple. No neck rigidity.  No rigidity, no meningismus  Cardiovascular: Normal rate, regular rhythm and normal heart sounds.   No murmur heard. Pulmonary/Chest: Effort normal and breath sounds normal. No respiratory distress. He has no wheezes. He has no rhonchi.  Abdominal: Soft. Bowel sounds are normal. There is no tenderness. There is no guarding.  Musculoskeletal: Normal range of motion. He  exhibits no edema.  Neurological: He is alert and oriented to person, place, and time. He has normal strength. He displays no tremor. No cranial nerve deficit or sensory deficit. He displays no seizure activity.  AAOx3, answering questions and following commands appropriately; equal strength UE and LE bilaterally; CN grossly intact; moves all extremities appropriately without ataxia; no focal neuro deficits or facial asymmetry appreciated  Skin: Skin is warm and dry. No rash noted. He is not diaphoretic.  Psychiatric: He has a normal mood and affect. His behavior is normal. Thought content normal.  Nursing note and vitals reviewed.    ED Treatments / Results  Labs (all labs ordered are listed, but only abnormal results are displayed) Labs Reviewed - No data to display  EKG  EKG Interpretation None       Radiology Ct Head Wo Contrast  Result Date: 09/11/2016 CLINICAL DATA:  Headache subtle for to 3 weeks EXAM: CT HEAD WITHOUT CONTRAST TECHNIQUE: Contiguous axial images were obtained from the base of the skull through the vertex without intravenous contrast. COMPARISON:  CT 11/25/2008 FINDINGS: Brain: No acute intracranial hemorrhage. No focal mass lesion. No CT evidence of acute infarction. No midline shift or mass effect. No hydrocephalus. Basilar cisterns are patent. Vascular: No hyperdense vessel or unexpected calcification. Skull: Normal. Negative for fracture or focal lesion. Sinuses/Orbits: Paranasal sinuses and mastoid air cells are clear. Orbits are clear. Other: None. IMPRESSION: Normal head CT Electronically Signed   By: Genevive Bi M.D.   On: 09/11/2016 16:02    Procedures Procedures (including critical care time)  Medications Ordered in ED Medications  sodium chloride 0.9 % bolus 1,000 mL (0 mLs Intravenous Stopped 09/11/16 1754)  diphenhydrAMINE (BENADRYL) injection 25 mg (25 mg Intravenous Given 09/11/16 1643)  metoCLOPramide (REGLAN) injection 10 mg (10 mg Intravenous  Given 09/11/16 1643)     Initial Impression / Assessment and Plan / ED Course  I have reviewed the triage vital signs and the nursing notes.  Pertinent labs & imaging results that were available during my care of the patient were reviewed by me and considered in my medical decision making (see chart for details).  36 year old male here with headaches for the past several weeks.  No history of migraines, but suspects that is what this is. He is afebrile and nontoxic. His neurologic exam is nonfocal. He has no signs or symptoms concerning for meningitis.  Given prolonged nature of his headache, CT head was obtained which is negative for acute findings. Patient treated here with migraine cocktail with resolution of headache.  States he is feeling much better.  Has eaten full meal here without issue.  Remains neurologically intact.  Feel he is stable for discharge.  Will refer to neurology, mom sees headache clinic so could follow-up there as well.  Rx fioricet for breakthrough.  Discussed plan with patient, he acknowledged understanding and agreed  with plan of care.  Return precautions given for new or worsening symptoms.  Final Clinical Impressions(s) / ED Diagnoses   Final diagnoses:  Bad headache    New Prescriptions Discharge Medication List as of 09/11/2016  5:30 PM    START taking these medications   Details  butalbital-acetaminophen-caffeine (FIORICET, ESGIC) 50-325-40 MG tablet Take 1 tablet by mouth every 6 (six) hours as needed for headache., Starting Wed 09/11/2016, Until Thu 09/11/2017, Print         Garlon Hatchet, PA-C 09/11/16 1809    Doug Sou, MD 09/11/16 2351

## 2016-09-11 NOTE — ED Notes (Signed)
Unsuccessful IV attempt x1. Patient refusing additional attempt by this RN.

## 2018-05-18 IMAGING — CT CT HEAD W/O CM
3 of 4 series · 14 of 47 positions shown, 16 images · non-contrast
Comparison: CT 11/25/2008

CLINICAL DATA: Headache subtle for to 3 weeks

EXAM:
CT HEAD WITHOUT CONTRAST
TECHNIQUE: Contiguous axial images were obtained from the base of the skull
through the vertex without intravenous contrast.

[Series 2: head w/o · axial · non-contrast · 0.45mm/px · z∈[-122,-2]mm · 8 of 28 slices shown, 10 images]
[im 2/28  brain]
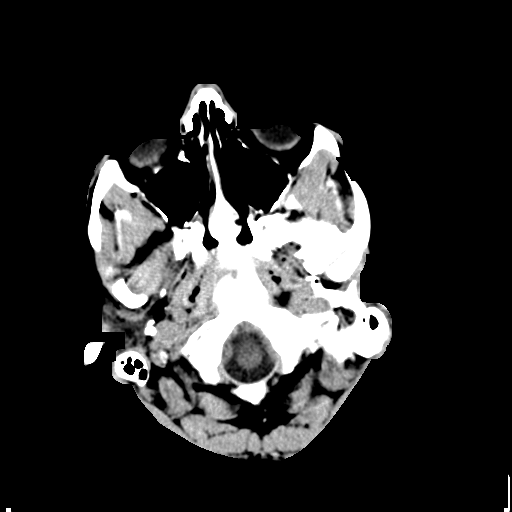
[im 2/28  bone]
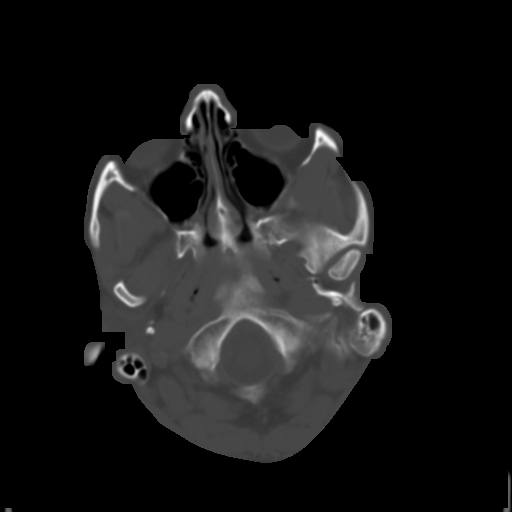
[im 6/28  brain]
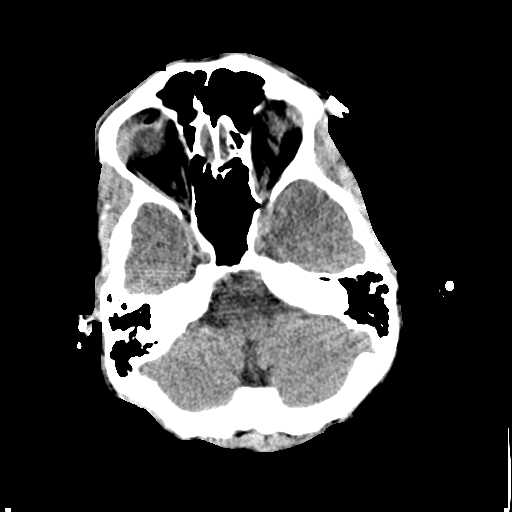
[im 10/28  brain]
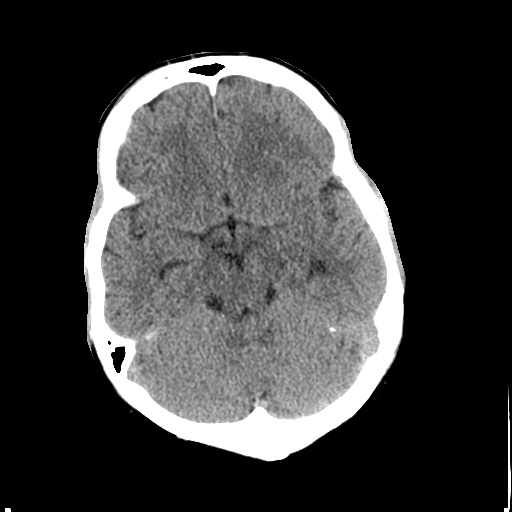
[im 12/28  brain]
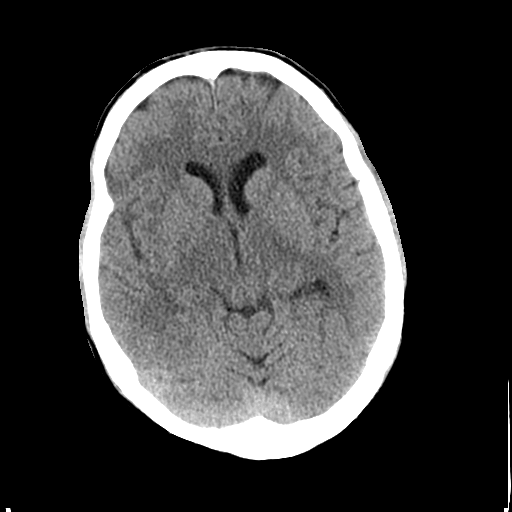
[im 16/28  brain]
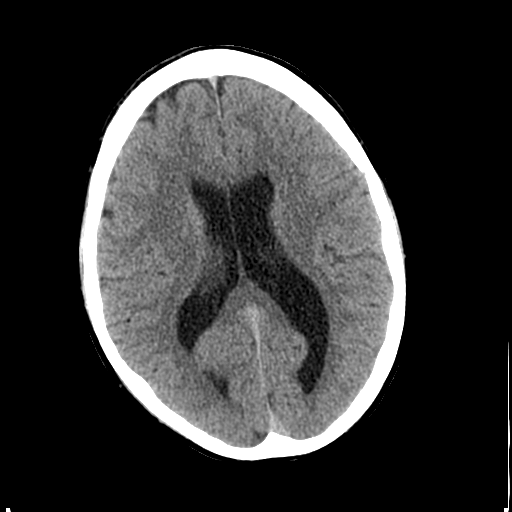
[im 16/28  bone]
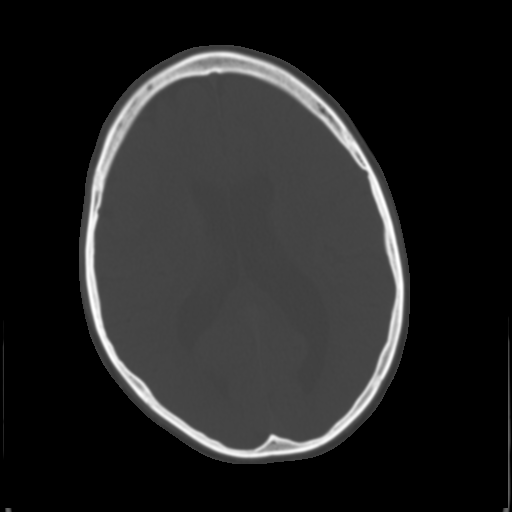
[im 18/28  brain]
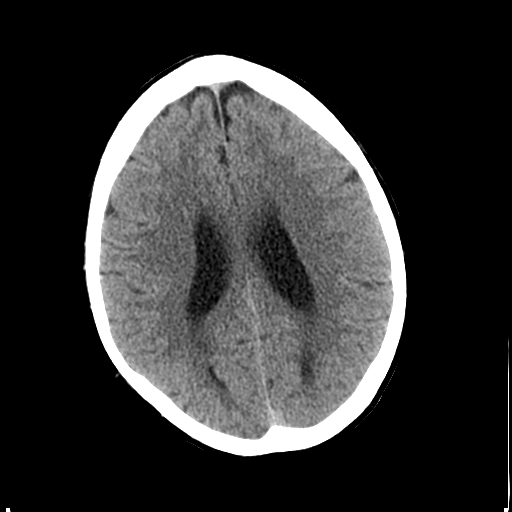
[im 22/28  brain]
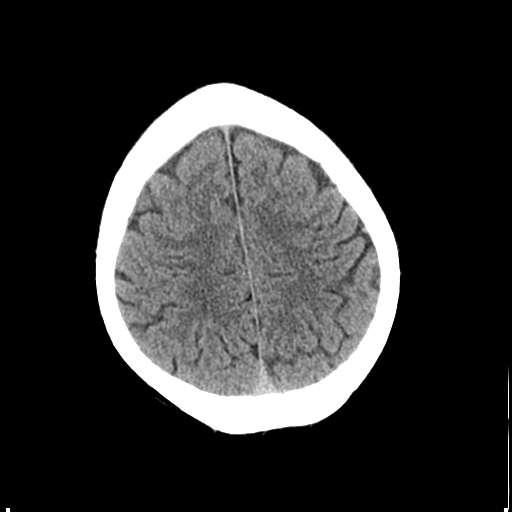
[im 26/28  brain]
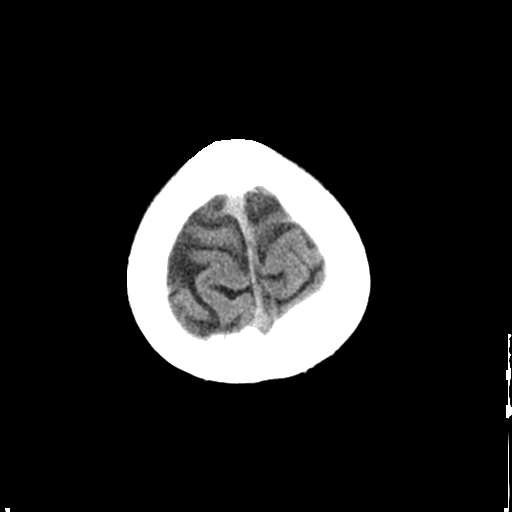

[Series 4: sagittal · sagittal · 0.27mm/px · 3 of 77 slices shown]
[im 26/77  brain]
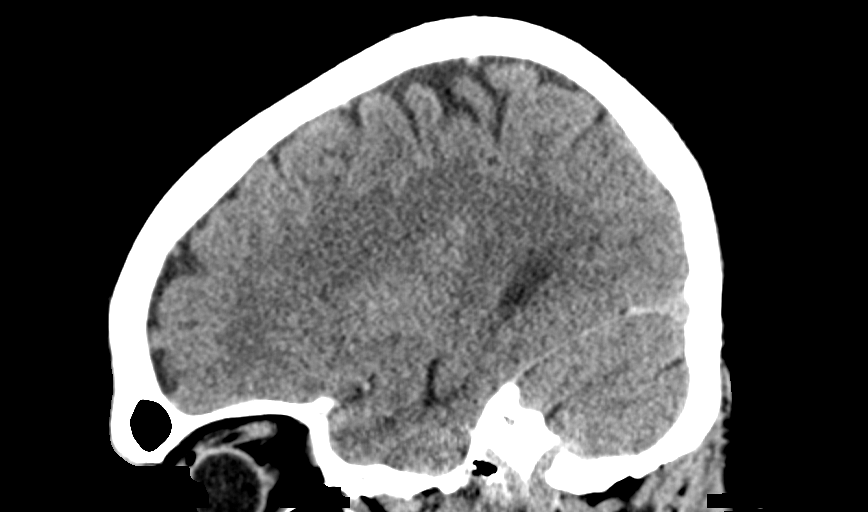
[im 39/77  brain]
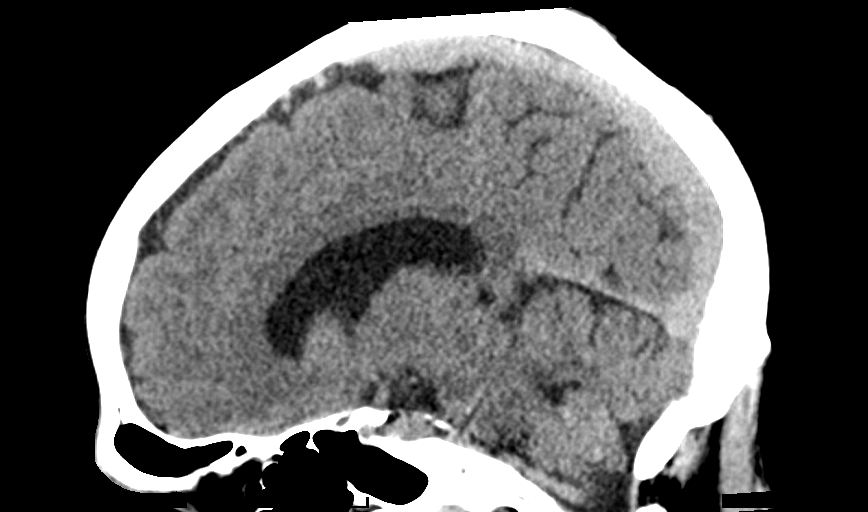
[im 51/77  brain]
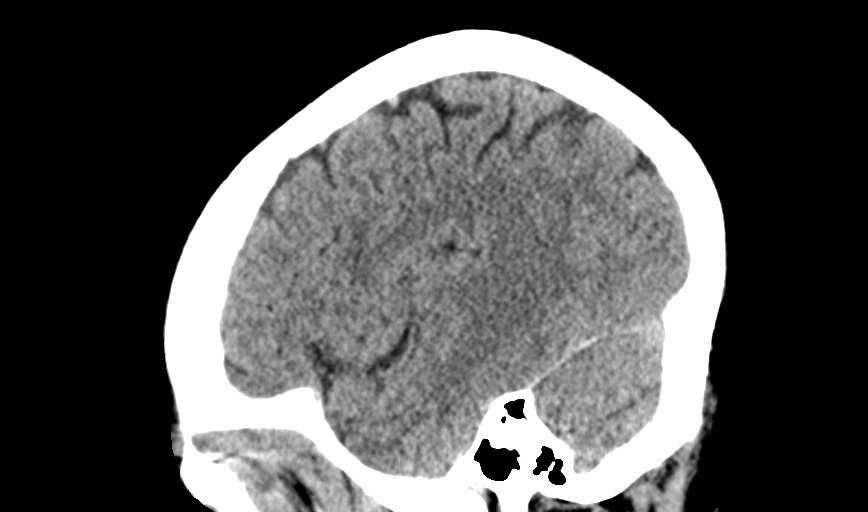

[Series 5: coronal · coronal · 0.27mm/px · 3 of 77 slices shown]
[im 26/77  brain]
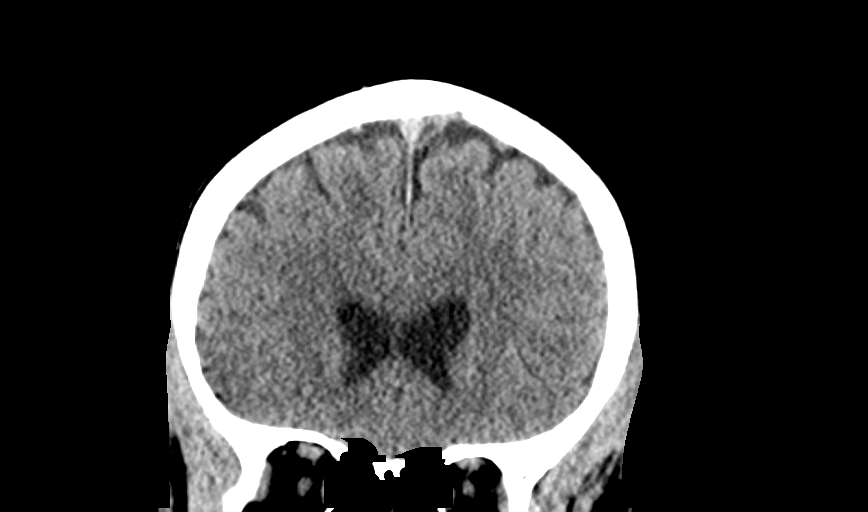
[im 34/77  brain]
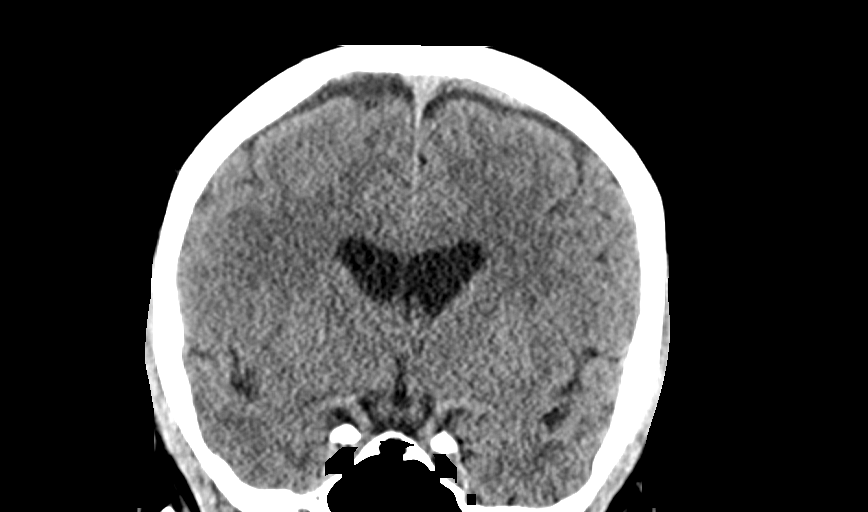
[im 43/77  brain]
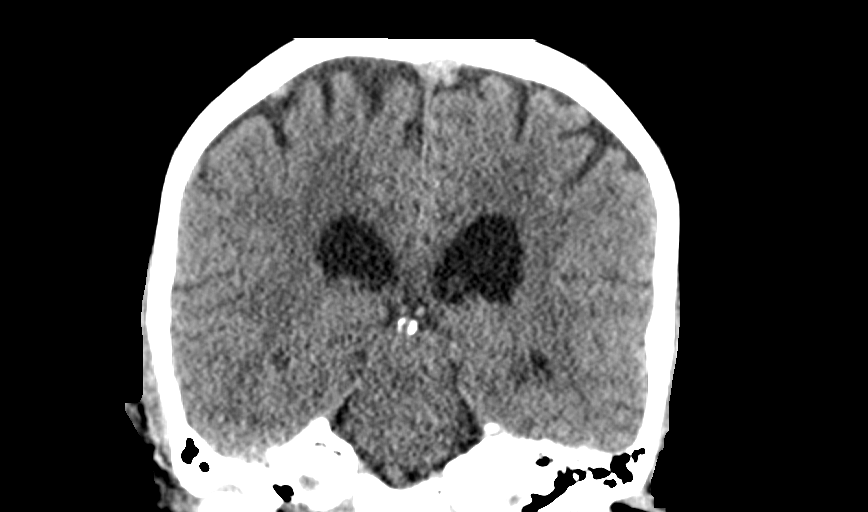

[14 of 47 positions shown; findings below may reference images not displayed]

FINDINGS: Brain: No acute intracranial hemorrhage. No focal mass lesion. No CT
evidence of acute infarction. No midline shift or mass effect. No
hydrocephalus. Basilar cisterns are patent.

Vascular: No hyperdense vessel or unexpected calcification.

Skull: Normal. Negative for fracture or focal lesion.

Sinuses/Orbits: Paranasal sinuses and mastoid air cells are clear.
Orbits are clear.

Other: None.
IMPRESSION: Normal head CT
